# Patient Record
Sex: Female | Born: 1990 | Race: Black or African American | Hispanic: No | Marital: Single | Smoking: Former smoker
Health system: Southern US, Community
[De-identification: ages and names within clinical notes are randomized; demographics above are authoritative.]

## PROBLEM LIST (undated history)

## (undated) ENCOUNTER — Inpatient Hospital Stay (HOSPITAL_COMMUNITY): Payer: Self-pay

## (undated) DIAGNOSIS — Z789 Other specified health status: Secondary | ICD-10-CM

## (undated) DIAGNOSIS — Z283 Underimmunization status: Secondary | ICD-10-CM

## (undated) DIAGNOSIS — O09899 Supervision of other high risk pregnancies, unspecified trimester: Secondary | ICD-10-CM

## (undated) DIAGNOSIS — F329 Major depressive disorder, single episode, unspecified: Secondary | ICD-10-CM

## (undated) DIAGNOSIS — O139 Gestational [pregnancy-induced] hypertension without significant proteinuria, unspecified trimester: Secondary | ICD-10-CM

## (undated) HISTORY — DX: Supervision of other high risk pregnancies, unspecified trimester: O09.899

## (undated) HISTORY — DX: Gestational (pregnancy-induced) hypertension without significant proteinuria, unspecified trimester: O13.9

## (undated) HISTORY — PX: NO PAST SURGERIES: SHX2092

## (undated) HISTORY — PX: DILATION AND CURETTAGE OF UTERUS: SHX78

## (undated) HISTORY — DX: Underimmunization status: Z28.3

## (undated) HISTORY — PX: INDUCED ABORTION: SHX677

## (undated) HISTORY — DX: Major depressive disorder, single episode, unspecified: F32.9

---
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
## 2013-03-28 ENCOUNTER — Encounter (HOSPITAL_COMMUNITY): Payer: Self-pay | Admitting: Emergency Medicine

## 2013-03-28 ENCOUNTER — Emergency Department (HOSPITAL_COMMUNITY)
Admission: EM | Admit: 2013-03-28 | Discharge: 2013-03-28 | Disposition: A | Discharge status: Home / Self Care | Payer: Self-pay | Attending: Emergency Medicine | Admitting: Emergency Medicine

## 2013-03-28 DIAGNOSIS — J029 Acute pharyngitis, unspecified: Secondary | ICD-10-CM | POA: Insufficient documentation

## 2013-03-28 DIAGNOSIS — R059 Cough, unspecified: Secondary | ICD-10-CM | POA: Insufficient documentation

## 2013-03-28 DIAGNOSIS — F17200 Nicotine dependence, unspecified, uncomplicated: Secondary | ICD-10-CM | POA: Insufficient documentation

## 2013-03-28 DIAGNOSIS — R05 Cough: Secondary | ICD-10-CM | POA: Insufficient documentation

## 2013-03-28 DIAGNOSIS — H9209 Otalgia, unspecified ear: Secondary | ICD-10-CM | POA: Insufficient documentation

## 2013-03-28 DIAGNOSIS — Z8659 Personal history of other mental and behavioral disorders: Secondary | ICD-10-CM | POA: Insufficient documentation

## 2013-03-28 DIAGNOSIS — Z8619 Personal history of other infectious and parasitic diseases: Secondary | ICD-10-CM | POA: Insufficient documentation

## 2013-03-28 LAB — RAPID STREP SCREEN (MED CTR MEBANE ONLY): Streptococcus, Group A Screen (Direct): NEGATIVE

## 2013-03-28 MED ORDER — IBUPROFEN 800 MG PO TABS: 800.0000 mg | ORAL_TABLET | Freq: Three times a day (TID) | ORAL | Status: DC

## 2013-03-28 MED ORDER — LIDOCAINE VISCOUS 2 % MT SOLN: 20.0000 mL | OROMUCOSAL | Status: DC | PRN

## 2013-03-28 NOTE — ED Notes (Signed)
Per pt sts sore throat since Tuesday night and hard to swallow

## 2013-03-28 NOTE — ED Provider Notes (Signed)
CSN: [id number]     Arrival date & time 03/28/13  1237 History  This chart was scribed for non-physician practitioner, Milus Mallick, PA-C working with Ward Givens, MD by Greggory Stallion, ED scribe. This patient was seen in room TR08C/TR08C and the patient's care was started at 1:58 PM.   Chief Complaint  Patient presents with  . Sore Throat   The history is provided by the patient. No language interpreter was used.   HPI Comments: Ashley Jennings is a 22 y.o. female who presents to the Emergency Department complaining of gradual onset, constant sore throat with associated mild non productive cough that started last night. Pt has states had right ear pain earlier but denies it currently. Denies any alleviating or aggravating factors. Denies fevers, rhinorrhea, congestion, fever. Denies sick contacts.   Past Medical History  Diagnosis Date  . No pertinent past medical history   . Pregnancy induced hypertension   . Depression jan 2012    states just short-term when her mom passed away   . Maternal varicella, non-immune 06/01/2011    Immunize postpartum    Past Surgical History  Procedure Laterality Date  . No past surgeries     Family History  Problem Relation Age of Onset  . Anesthesia problems Neg Hx   . Diabetes Mother   . Kidney disease Mother   . Hypertension Mother    History  Substance Use Topics  . Smoking status: Current Some Day Smoker  . Smokeless tobacco: Never Used  . Alcohol Use: No   OB History   Grav Para Term Preterm Abortions TAB SAB Ect Mult Living   1 1 1  0 0 0 0 0 0 1     Review of Systems  Constitutional: Negative for fever.  HENT: Positive for sore throat. Negative for congestion, ear pain and rhinorrhea.   Respiratory: Positive for cough.   All other systems reviewed and are negative.    Allergies  Review of patient's allergies indicates no known allergies.  Home Medications   Current Outpatient Rx  Name  Route  Sig  Dispense   Refill  . ibuprofen (ADVIL,MOTRIN) 800 MG tablet   Oral   Take 1 tablet (800 mg total) by mouth 3 (three) times daily.   21 tablet   0   . lidocaine (XYLOCAINE) 2 % solution   Mouth/Throat   Use as directed 20 mLs in the mouth or throat as needed for mouth pain.   100 mL   0     BP 131/75  Pulse 91  Temp(Src) 99.3 F (37.4 C) (Oral)  SpO2 99%  Physical Exam  Constitutional: She is oriented to person, place, and time. She appears well-developed and well-nourished. No distress.  HENT:  Head: Normocephalic and atraumatic.  Right Ear: Tympanic membrane, external ear and ear canal normal.  Left Ear: Tympanic membrane, external ear and ear canal normal.  Nose: Nose normal.  Mouth/Throat: Uvula is midline, oropharynx is clear and moist and mucous membranes are normal. No trismus in the jaw. No oropharyngeal exudate, posterior oropharyngeal edema or tonsillar abscesses.  Eyes: Conjunctivae are normal.  Neck: Normal range of motion. Neck supple.  Cardiovascular: Normal rate, regular rhythm and normal heart sounds.   Pulmonary/Chest: Effort normal and breath sounds normal. No respiratory distress. She has no wheezes. She exhibits no tenderness.  Abdominal: Soft. There is no tenderness.  Musculoskeletal: Normal range of motion.  Lymphadenopathy:    She has cervical adenopathy.  Neurological: She  is alert and oriented to person, place, and time.  Skin: Skin is warm and dry. She is not diaphoretic.  Psychiatric: She has a normal mood and affect.    ED Course  Procedures (including critical care time)  DIAGNOSTIC STUDIES: Oxygen Saturation is 99% on RA, normal by my interpretation.    COORDINATION OF CARE: 2:00 PM-Discussed treatment plan which includes magic mouth wash and alternating tylenol and ibuprofen with pt at bedside and pt agreed to plan.   Labs Review Labs Reviewed  RAPID STREP SCREEN  CULTURE, GROUP A STREP   Imaging Review No results found.  EKG  Interpretation   None       MDM   1. Viral pharyngitis     Afebrile, NAD, non-toxic appearing, AAOx4. Pt afebrile without tonsillar exudate, negative strep. Presents with mild cervical lymphadenopathy, & dysphagia; diagnosis of viral pharyngitis. No abx indicated. DC w symptomatic tx for pain  Pt does not appear dehydrated, but did discuss importance of water rehydration. Presentation non concerning for PTA or infxn spread to soft tissue. No trismus or uvula deviation. Specific return precautions discussed. Pt able to drink water in ED without difficulty with intact air way. Recommended PCP follow up. Return precautions discussed. Patient is agreeable to plan. Patient is stable at time of discharge      I personally performed the services described in this documentation, which was scribed in my presence. The recorded information has been reviewed and is accurate.    Jeannetta Ellis, PA-C 03/28/13 1537

## 2013-03-28 NOTE — ED Provider Notes (Signed)
Medical screening examination/treatment/procedure(s) were performed by non-physician practitioner and as supervising physician I was immediately available for consultation/collaboration.  EKG Interpretation   None       Marcoantonio Legault, MD, FACEP   Yonna Alwin L Wanita Derenzo, MD 03/28/13 1620 

## 2013-03-30 LAB — CULTURE, GROUP A STREP

## 2013-09-17 IMAGING — US US OB FOLLOW-UP
1 series · 12 of 28 positions shown · non-contrast
Comparison: none

[Series 1: us ob follow up · 12 of 47 slices shown]
[im 2/47]
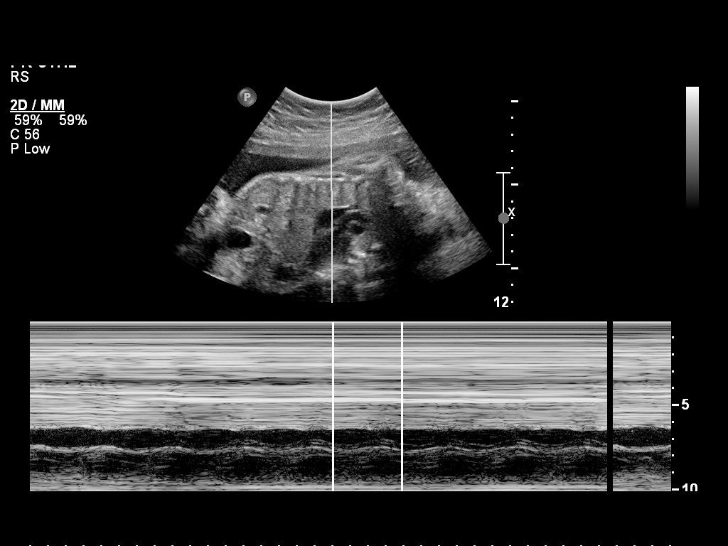
[im 6/47]
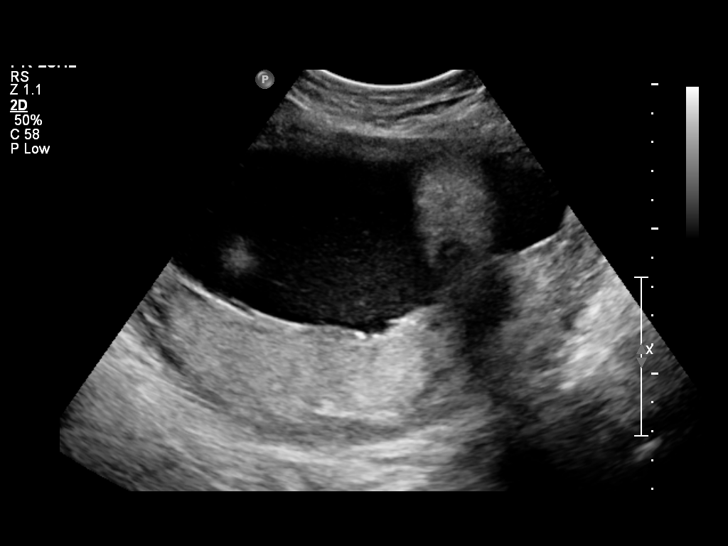
[im 9/47]
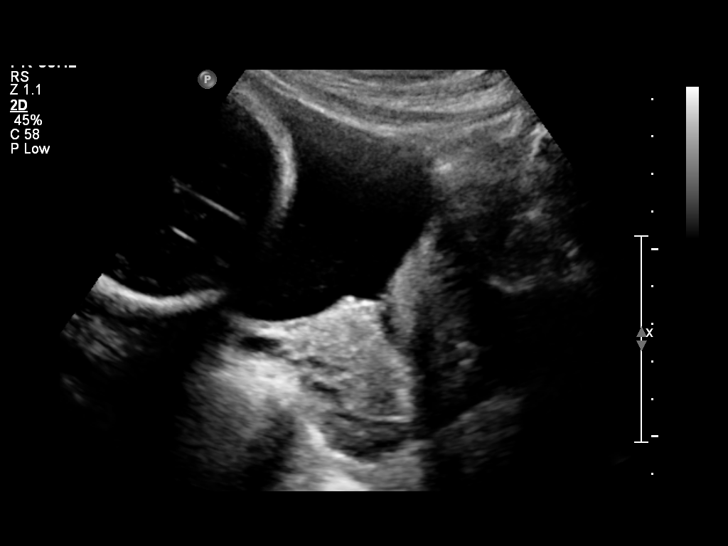
[im 14/47]
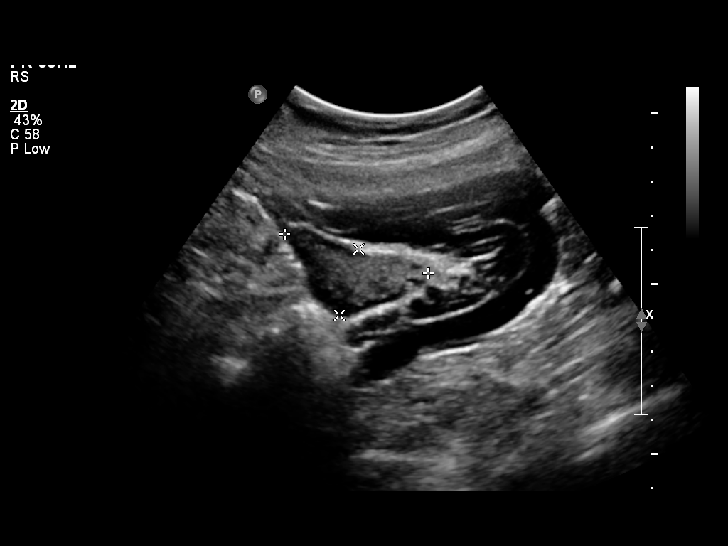
[im 18/47]
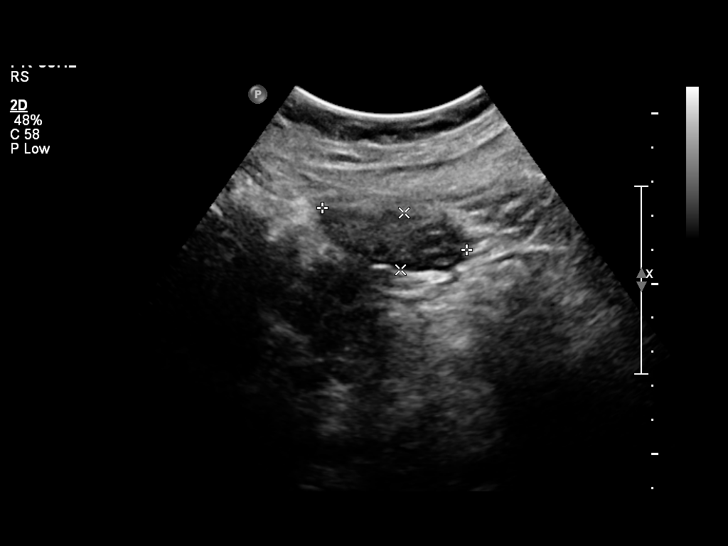
[im 21/47]
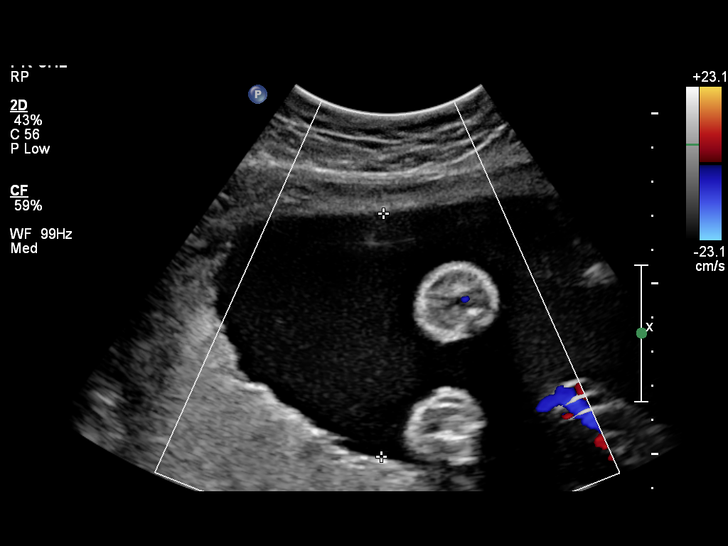
[im 26/47]
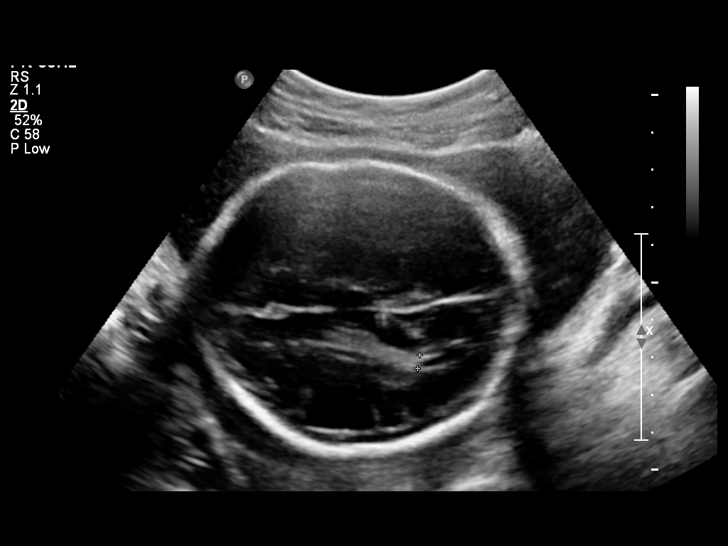
[im 29/47]
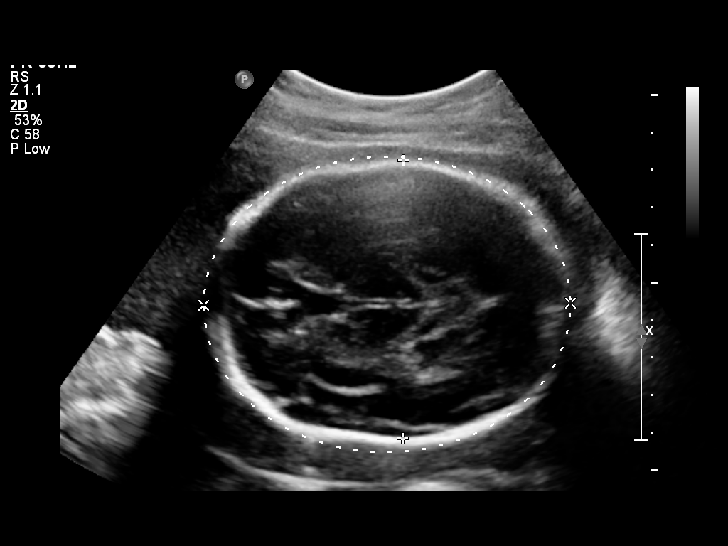
[im 33/47]
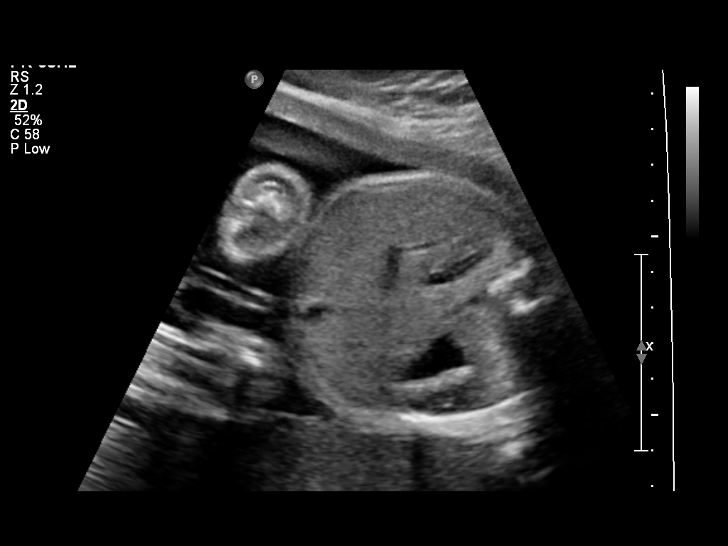
[im 38/47]
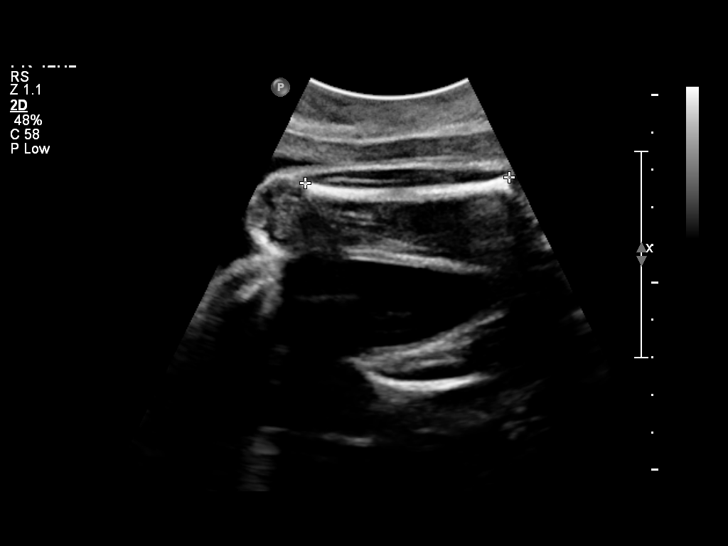
[im 41/47]
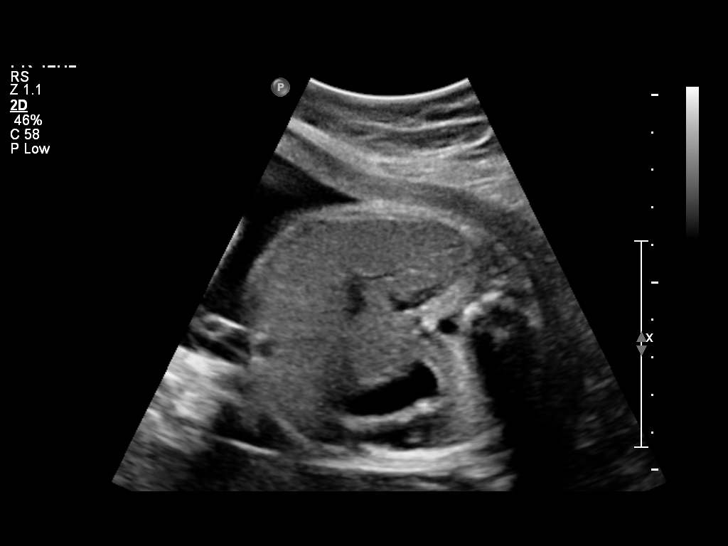
[im 45/47]
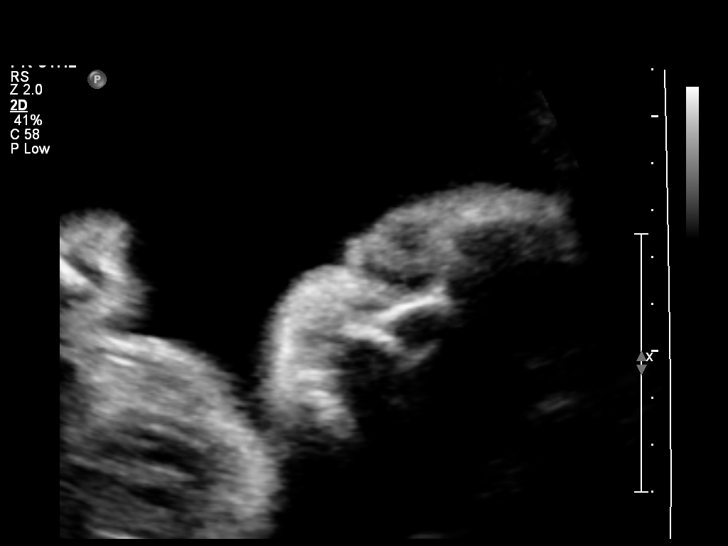

[12 of 28 positions shown; findings below may reference images not displayed]

OBSTETRICS REPORT
                      (Signed Final 05/17/2011 [DATE])

          [facility]

 Order#:         [id number]
Procedures

 US OB FOLLOW UP                                       76816.1
Indications

 Assess Fetal Growth / Estimated Fetal Weight
 Size greater than dates (Large for gestational [AGE]
Fetal Evaluation

 Fetal Heart Rate:  155                         bpm
 Cardiac Activity:  Observed
 Presentation:      Cephalic
 Placenta:          Posterior, above cervical
                    os
 P. Cord            Previously Visualized
 Insertion:

 Amniotic Fluid
 AFI FV:      Subjectively within normal limits
 AFI Sum:     16.41   cm      60   %Tile     Larg Pckt:     7.1  cm
 RUQ:   7.1    cm    RLQ:    4.72   cm    LUQ:   4.59    cm
Biometry

 BPD:     74.1  mm    G. Age:   29w 5d                CI:         72.1   70 - 86
                                                      FL/HC:      19.6   18.8 -

 HC:     277.7  mm    G. Age:   30w 3d       77  %    HC/AC:      1.16   1.05 -

 AC:     240.1  mm    G. Age:   28w 2d       39  %    FL/BPD:     73.4   71 - 87
 FL:      54.4  mm    G. Age:   28w 5d       45  %    FL/AC:      22.7   20 - 24

 Est. FW:    1400  gm    2 lb 13 oz      57  %
Gestational Age

 LMP:           28w 3d       Date:   10/30/10                 EDD:   08/06/11
 U/S Today:     29w 2d                                        EDD:   07/31/11
 Best:          28w 3d    Det. By:   LMP  (10/30/10)          EDD:   08/06/11
Anatomy
 Cranium:           Appears normal      Aortic Arch:       Previously seen
 Fetal Cavum:       Previously seen     Ductal Arch:       Previously seen
 Ventricles:        Appears normal      Diaphragm:         Appears normal
 Choroid Plexus:    Previously seen     Stomach:           Appears normal
 Cerebellum:        Previously seen     Abdomen:           Appears normal
 Posterior Fossa:   Previously seen     Abdominal Wall:    Previously seen
 Nuchal Fold:       Not applicable      Cord Vessels:      Previously seen
                    (>20 wks GA)
 Face:              Previously seen     Kidneys:           Appear normal
 Heart:             Not well            Bladder:           Appears normal
                    visualized
                    today. Seen
                    previously.
 RVOT:              Previously seen     Spine:             Previously seen
 LVOT:              Previously seen     Limbs:             Previously seen

 Other:     Female gender previously seen. Heels and 5th digit
            previously visualized.
Targeted Anatomy

 Fetal Central Nervous System
 Lat. Ventricles:
Cervix Uterus Adnexa

 Cervical Length:   3.22      cm

 Cervix:       Normal appearance by transabdominal scan.
 Left Ovary:   Within normal limits.
 Right Ovary:  Within normal limits.
 Adnexa:     No abnormality visualized.
Impression

 Single live IUP in cephalic presentation.  Concordant
 measurements/assigned GA by LMP.
 No late-developing anomaly in visualized structures above.

## 2014-02-04 ENCOUNTER — Encounter (HOSPITAL_COMMUNITY): Payer: Self-pay | Admitting: Emergency Medicine

## 2016-06-18 ENCOUNTER — Emergency Department (HOSPITAL_COMMUNITY): Payer: Self-pay

## 2016-06-18 ENCOUNTER — Emergency Department (HOSPITAL_COMMUNITY)
Admission: EM | Admit: 2016-06-18 | Discharge: 2016-06-18 | Disposition: A | Discharge status: Home / Self Care | Payer: Self-pay | Attending: Physician Assistant | Admitting: Physician Assistant

## 2016-06-18 ENCOUNTER — Encounter (HOSPITAL_COMMUNITY): Payer: Self-pay

## 2016-06-18 DIAGNOSIS — O23591 Infection of other part of genital tract in pregnancy, first trimester: Secondary | ICD-10-CM | POA: Insufficient documentation

## 2016-06-18 DIAGNOSIS — F17200 Nicotine dependence, unspecified, uncomplicated: Secondary | ICD-10-CM | POA: Insufficient documentation

## 2016-06-18 DIAGNOSIS — O99331 Smoking (tobacco) complicating pregnancy, first trimester: Secondary | ICD-10-CM | POA: Insufficient documentation

## 2016-06-18 DIAGNOSIS — Z3A11 11 weeks gestation of pregnancy: Secondary | ICD-10-CM | POA: Insufficient documentation

## 2016-06-18 DIAGNOSIS — Z79899 Other long term (current) drug therapy: Secondary | ICD-10-CM | POA: Insufficient documentation

## 2016-06-18 DIAGNOSIS — N760 Acute vaginitis: Secondary | ICD-10-CM

## 2016-06-18 DIAGNOSIS — Z9889 Other specified postprocedural states: Secondary | ICD-10-CM

## 2016-06-18 DIAGNOSIS — O0281 Inappropriate change in quantitative human chorionic gonadotropin (hCG) in early pregnancy: Secondary | ICD-10-CM | POA: Insufficient documentation

## 2016-06-18 DIAGNOSIS — B9689 Other specified bacterial agents as the cause of diseases classified elsewhere: Secondary | ICD-10-CM | POA: Insufficient documentation

## 2016-06-18 LAB — CBC WITH DIFFERENTIAL/PLATELET
Basophils Absolute: 0 10*3/uL (ref 0.0–0.1)
Basophils Relative: 1 %
Eosinophils Absolute: 0.1 10*3/uL (ref 0.0–0.7)
Eosinophils Relative: 2 %
HCT: 36.3 % (ref 36.0–46.0)
Hemoglobin: 11.8 g/dL — ABNORMAL LOW (ref 12.0–15.0)
Lymphocytes Relative: 32 %
Lymphs Abs: 1.5 10*3/uL (ref 0.7–4.0)
MCH: 26.3 pg (ref 26.0–34.0)
MCHC: 32.5 g/dL (ref 30.0–36.0)
MCV: 80.8 fL (ref 78.0–100.0)
Monocytes Absolute: 0.5 10*3/uL (ref 0.1–1.0)
Monocytes Relative: 11 %
Neutro Abs: 2.5 10*3/uL (ref 1.7–7.7)
Neutrophils Relative %: 54 %
Platelets: 307 10*3/uL (ref 150–400)
RBC: 4.49 MIL/uL (ref 3.87–5.11)
RDW: 15.7 % — ABNORMAL HIGH (ref 11.5–15.5)
WBC: 4.6 10*3/uL (ref 4.0–10.5)

## 2016-06-18 LAB — URINALYSIS, ROUTINE W REFLEX MICROSCOPIC
Bilirubin Urine: NEGATIVE
Glucose, UA: NEGATIVE mg/dL
Ketones, ur: NEGATIVE mg/dL
Nitrite: NEGATIVE
Protein, ur: 30 mg/dL — AB
Specific Gravity, Urine: 1.026 (ref 1.005–1.030)
pH: 6 (ref 5.0–8.0)

## 2016-06-18 LAB — WET PREP, GENITAL
Sperm: NONE SEEN
Trich, Wet Prep: NONE SEEN
Yeast Wet Prep HPF POC: NONE SEEN

## 2016-06-18 LAB — HCG, QUANTITATIVE, PREGNANCY: hCG, Beta Chain, Quant, S: 159 m[IU]/mL — ABNORMAL HIGH (ref <5)

## 2016-06-18 MED ORDER — METRONIDAZOLE 500 MG PO TABS
500.0000 mg | ORAL_TABLET | Freq: Once | ORAL | Status: AC
  Administered 2016-06-18: 500 mg via ORAL
  Filled 2016-06-18: qty 1

## 2016-06-18 MED ORDER — METRONIDAZOLE 500 MG PO TABS: 500.0000 mg | ORAL_TABLET | Freq: Two times a day (BID) | ORAL | 0 refills | Status: AC

## 2016-06-18 MED ORDER — IBUPROFEN 800 MG PO TABS
800.0000 mg | ORAL_TABLET | Freq: Once | ORAL | Status: AC
  Administered 2016-06-18: 800 mg via ORAL
  Filled 2016-06-18: qty 1

## 2016-06-18 NOTE — ED Notes (Signed)
Patient transported to Ultrasound 

## 2016-06-18 NOTE — ED Triage Notes (Signed)
Pt presents for evaluation of vaginal discharge and odor x 3-4 days. Pt reports having surgical abortion 3/2. States vaginal bleeding since. Reports had some intermittent vaginal odor x 1-2 months but has not been seen.

## 2016-06-18 NOTE — ED Provider Notes (Signed)
[facility] DEPT Provider Note   CSN: [id number] Arrival date & time: 06/18/16  0845     History   Chief Complaint Chief Complaint  Patient presents with  . Vaginal Discharge    HPI Ashley Jennings is a 26 y.o. female [id number] who presents today with chief complaint of vaginal discharge and odor. She states she had an abortion on 3/2 when she was [redacted] weeks gestational age and has had 6/10 abdominal cramping with intermittent nausea and back pain since then. She took ibuprofen for this which was helpful but has since run out. She noticed a foul odor from her vagina 4 days ago, and states developing copious yellow, "chunky" discharge 2 days ago. She states she had the odor intermittently prior to her abortion but "this time it smells really bad" and had never been accompanied by the discharge before. Discharge is somewhat itchy. Denies recent abx use. Also endorses vaginal bleeding since procedure which is now improving.   Denies fever/chills, HA, dizziness, CP/SOB, vomitting, diarrhea, constipation, melena, hematuria, dysuria.  The history is provided by the patient.    Past Medical History:  Diagnosis Date  . Depression jan 2012   states just short-term when her mom passed away   . Maternal varicella, non-immune 06/01/2011   Immunize postpartum   . No pertinent past medical history   . Pregnancy induced hypertension     Patient Active Problem List   Diagnosis Date Noted  . Postpartum examination following vaginal delivery 09/02/2011  . Gonorrhea complicating pregnancy in second trimester 06/17/2011  . Gestational HTN 06/01/2011    Past Surgical History:  Procedure Laterality Date  . DILATION AND CURETTAGE OF UTERUS    . NO PAST SURGERIES      OB History    Gravida Para Term Preterm AB Living   1 1 1  0 0 1   SAB TAB Ectopic Multiple Live Births   0 0 0 0 1       Home Medications    Prior to Admission medications   Medication Sig Start Date End Date  Taking? Authorizing Provider  ibuprofen (ADVIL,MOTRIN) 800 MG tablet Take 1 tablet (800 mg total) by mouth 3 (three) times daily. Patient not taking: Reported on 06/18/2016 03/28/13   Baron Sane, PA-C  lidocaine (XYLOCAINE) 2 % solution Use as directed 20 mLs in the mouth or throat as needed for mouth pain. Patient not taking: Reported on 06/18/2016 03/28/13   Baron Sane, PA-C  metroNIDAZOLE (FLAGYL) 500 MG tablet Take 1 tablet (500 mg total) by mouth 2 (two) times daily. 06/18/16 06/25/16  Renita Papa, PA    Family History Family History  Problem Relation Age of Onset  . Diabetes Mother   . Kidney disease Mother   . Hypertension Mother   . Anesthesia problems Neg Hx     Social History Social History  Substance Use Topics  . Smoking status: Current Some Day Smoker  . Smokeless tobacco: Never Used  . Alcohol use No     Allergies   Patient has no known allergies.   Review of Systems Review of Systems  Constitutional: Negative for chills and fever.  Respiratory: Negative for chest tightness and shortness of breath.   Cardiovascular: Negative for chest pain and leg swelling.  Gastrointestinal: Positive for abdominal pain and nausea. Negative for abdominal distention, blood in stool, constipation, diarrhea and vomiting.  Genitourinary: Positive for vaginal bleeding and vaginal discharge. Negative for dysuria and hematuria.  Musculoskeletal: Positive for back  pain.  Skin: Negative for pallor and wound.  Neurological: Negative for dizziness, light-headedness and headaches.     Physical Exam Updated Vital Signs BP 127/75   Pulse 63   Temp 98.1 F (36.7 C) (Oral)   Resp 16   Ht 5\' 9"  (1.753 m)   Wt 90.7 kg   LMP 03/18/2016 (Approximate)   SpO2 98%   BMI 29.53 kg/m   Physical Exam  Constitutional: She is oriented to person, place, and time. She appears well-developed and well-nourished. No distress.  HENT:  Head: Normocephalic and atraumatic.  Eyes:  Conjunctivae are normal. Right eye exhibits no discharge. Left eye exhibits no discharge. No scleral icterus.  Neck: Neck supple. No JVD present. No tracheal deviation present. No thyromegaly present.  Cardiovascular: Normal rate, regular rhythm, normal heart sounds and intact distal pulses.   Pulmonary/Chest: Effort normal and breath sounds normal.  Abdominal: Soft. Bowel sounds are normal. There is tenderness.  Suprapubic tenderness to palpation  Genitourinary: Vaginal discharge found.  Genitourinary Comments: Yellow, malodorous discharge, no blood noted. No plaques or petechia noted. No cervical wall motion tenderness, no masses or abnormalities palpated on bimanual exam; ovaries nonpalpable, uterus soft and not enlarged. No products of conception visualized.  Musculoskeletal: Normal range of motion. She exhibits no deformity.  Neurological: She is alert and oriented to person, place, and time.  Skin: Skin is warm and dry. Capillary refill takes less than 2 seconds. She is not diaphoretic.  Psychiatric: She has a normal mood and affect. Her behavior is normal. Judgment and thought content normal.     ED Treatments / Results  Labs (all labs ordered are listed, but only abnormal results are displayed) Labs Reviewed  WET PREP, GENITAL - Abnormal; Notable for the following:       Result Value   Clue Cells Wet Prep HPF POC PRESENT (*)    WBC, Wet Prep HPF POC MANY (*)    All other components within normal limits  HCG, QUANTITATIVE, PREGNANCY - Abnormal; Notable for the following:    hCG, Beta Chain, Quant, S 159 (*)    All other components within normal limits  CBC WITH DIFFERENTIAL/PLATELET - Abnormal; Notable for the following:    Hemoglobin 11.8 (*)    RDW 15.7 (*)    All other components within normal limits  URINALYSIS, ROUTINE W REFLEX MICROSCOPIC - Abnormal; Notable for the following:    Hgb urine dipstick SMALL (*)    Protein, ur 30 (*)    Leukocytes, UA TRACE (*)     Bacteria, UA MANY (*)    Squamous Epithelial / LPF 0-5 (*)    All other components within normal limits  GC/CHLAMYDIA PROBE AMP ([place]) NOT AT [facility]    EKG  EKG Interpretation None       Radiology US Ob Comp < 14 Wks  Result Date: 06/18/2016 CLINICAL DATA:  Pelvic pain and vaginal bleeding beginning today. Two weeks status post therapeutic abortion. Positive beta HCG. EXAM: OBSTETRIC <14 WK [place] AND TRANSVAGINAL OB US TECHNIQUE: Both transabdominal and transvaginal ultrasound examinations were performed for complete evaluation of the gestation as well as the maternal uterus, adnexal regions, and pelvic cul-de-sac. Transvaginal technique was performed to assess early pregnancy. COMPARISON:  None. FINDINGS: Intrauterine gestational sac: None Endometrium shows heterogeneous echotexture and measures 18 mm in thickness. No definite internal blood flow seen on color Doppler ultrasound. Both ovaries are normal in appearance. No evidence of adnexal mass or free fluid. IMPRESSION: Heterogeneous endometrial  thickening measuring 18 mm. This is suspicious for hypovascular retained products of conception. Normal appearance of both ovaries. No evidence of adnexal mass or free fluid. Electronically Signed   By: Earle Gell M.D.   On: 06/18/2016 12:41   US Ob Transvaginal  Result Date: 06/18/2016 CLINICAL DATA:  Pelvic pain and vaginal bleeding beginning today. Two weeks status post therapeutic abortion. Positive beta HCG. EXAM: OBSTETRIC <14 WK [place] AND TRANSVAGINAL OB US TECHNIQUE: Both transabdominal and transvaginal ultrasound examinations were performed for complete evaluation of the gestation as well as the maternal uterus, adnexal regions, and pelvic cul-de-sac. Transvaginal technique was performed to assess early pregnancy. COMPARISON:  None. FINDINGS: Intrauterine gestational sac: None Endometrium shows heterogeneous echotexture and measures 18 mm in thickness. No definite internal blood flow seen on  color Doppler ultrasound. Both ovaries are normal in appearance. No evidence of adnexal mass or free fluid. IMPRESSION: Heterogeneous endometrial thickening measuring 18 mm. This is suspicious for hypovascular retained products of conception. Normal appearance of both ovaries. No evidence of adnexal mass or free fluid. Electronically Signed   By: Earle Gell M.D.   On: 06/18/2016 12:41    Procedures Procedures (including critical care time)  Medications Ordered in ED Medications  metroNIDAZOLE (FLAGYL) tablet 500 mg (not administered)  ibuprofen (ADVIL,MOTRIN) tablet 800 mg (800 mg Oral Given 06/18/16 1127)     Initial Impression / Assessment and Plan / ED Course  I have reviewed the triage vital signs and the nursing notes.  Pertinent labs & imaging results that were available during my care of the patient were reviewed by me and considered in my medical decision making (see chart for details).     25yof presents to ED today with chief complaint of vaginal odor and discharge s/p D&C 2 weeks ago. Pt afebrile, VSS. Provided pt with ibuprofen for pain, with some improvement on re-evaluation. Quantitative beta HCG shows low amount inconsistent with expected gestational age. Pelvic exam shows yellow malodorous discharge with no blood or products of conception visualized. Wet prep shows many clue cells supporting dx of bacterial vaginosis.  Transvaginal US shows heterogenous endometrial thickening and suspicion for retained products of conception. No evidence of adnexal mass or free fluid, low suspicion of ovarian cyst/torsion. Consulted with [facility], who performed D&C on 3/2, who recommended pt come in to their office tomorrow at [facility] for re-evaluation and possible repetition of procedure. Pt also given first dose of Flagyl for treatment of BV and instructed how to complete course. Pt informed she should avoid alcohol while on abx. She is aware of follow up tomorrow with [facility] and  need for driver. Also discussed indications for return to ED including development of fever/chills, vaginal bleeding, and severe abdominal pain.   Pt seen and evaluated by Dr. Thomasene Lot. Final Clinical Impressions(s) / ED Diagnoses   Final diagnoses:  BV (bacterial vaginosis)  Status post D&C    New Prescriptions New Prescriptions   METRONIDAZOLE (FLAGYL) 500 MG TABLET    Take 1 tablet (500 mg total) by mouth 2 (two) times daily.     Renita Papa, [place] 06/18/16 [place], MD 06/18/16 2145

## 2016-06-21 LAB — GC/CHLAMYDIA PROBE AMP (~~LOC~~) NOT AT ARMC
Chlamydia: POSITIVE — AB
Neisseria Gonorrhea: NEGATIVE

## 2016-06-24 ENCOUNTER — Emergency Department (HOSPITAL_COMMUNITY)
Admission: EM | Admit: 2016-06-24 | Discharge: 2016-06-24 | Disposition: A | Discharge status: Home / Self Care | Payer: Self-pay | Attending: Emergency Medicine | Admitting: Emergency Medicine

## 2016-06-24 ENCOUNTER — Encounter (HOSPITAL_COMMUNITY): Payer: Self-pay

## 2016-06-24 DIAGNOSIS — Z79899 Other long term (current) drug therapy: Secondary | ICD-10-CM | POA: Insufficient documentation

## 2016-06-24 DIAGNOSIS — F17200 Nicotine dependence, unspecified, uncomplicated: Secondary | ICD-10-CM | POA: Insufficient documentation

## 2016-06-24 DIAGNOSIS — A749 Chlamydial infection, unspecified: Secondary | ICD-10-CM | POA: Insufficient documentation

## 2016-06-24 MED ORDER — CEFTRIAXONE SODIUM 250 MG IJ SOLR
250.0000 mg | Freq: Once | INTRAMUSCULAR | Status: AC
  Administered 2016-06-24: 250 mg via INTRAMUSCULAR
  Filled 2016-06-24: qty 250

## 2016-06-24 MED ORDER — STERILE WATER FOR INJECTION IJ SOLN
INTRAMUSCULAR | Status: AC
  Administered 2016-06-24: 2 mL
  Filled 2016-06-24: qty 10

## 2016-06-24 MED ORDER — AZITHROMYCIN 250 MG PO TABS
1000.0000 mg | ORAL_TABLET | Freq: Once | ORAL | Status: AC
  Administered 2016-06-24: 1000 mg via ORAL
  Filled 2016-06-24: qty 4

## 2016-06-24 NOTE — ED Provider Notes (Signed)
[facility] DEPT Provider Note   CSN: [id number] Arrival date & time: 06/24/16  0830     History   Chief Complaint Chief Complaint  Patient presents with  . Exposure to STD    HPI Willisha L Beaird is a 26 y.o. female.  Patient is a 26 year old female who presents for evaluation of STD. She was seen one week ago and had cervical swabs obtained. She was notified that her chlamydia test was positive. She denies any other complaints. She is only here for treatment.   The history is provided by the patient.  Exposure to STD  This is a new problem. Episode onset: 1 week ago. The problem occurs constantly. The problem has not changed since onset.Pertinent negatives include no abdominal pain. Nothing aggravates the symptoms. Nothing relieves the symptoms. She has tried nothing for the symptoms.    Past Medical History:  Diagnosis Date  . Depression jan 2012   states just short-term when her mom passed away   . Maternal varicella, non-immune 06/01/2011   Immunize postpartum   . No pertinent past medical history   . Pregnancy induced hypertension     Patient Active Problem List   Diagnosis Date Noted  . Postpartum examination following vaginal delivery 09/02/2011  . Gonorrhea complicating pregnancy in second trimester 06/17/2011  . Gestational HTN 06/01/2011    Past Surgical History:  Procedure Laterality Date  . DILATION AND CURETTAGE OF UTERUS    . INDUCED ABORTION    . NO PAST SURGERIES      OB History    Gravida Para Term Preterm AB Living   1 1 1  0 0 1   SAB TAB Ectopic Multiple Live Births   0 0 0 0 1       Home Medications    Prior to Admission medications   Medication Sig Start Date End Date Taking? Authorizing Provider  ibuprofen (ADVIL,MOTRIN) 800 MG tablet Take 1 tablet (800 mg total) by mouth 3 (three) times daily. Patient not taking: Reported on 06/18/2016 03/28/13   Baron Sane, PA-C  lidocaine (XYLOCAINE) 2 % solution Use as  directed 20 mLs in the mouth or throat as needed for mouth pain. Patient not taking: Reported on 06/18/2016 03/28/13   Baron Sane, PA-C  metroNIDAZOLE (FLAGYL) 500 MG tablet Take 1 tablet (500 mg total) by mouth 2 (two) times daily. 06/18/16 06/25/16  Renita Papa, PA    Family History Family History  Problem Relation Age of Onset  . Diabetes Mother   . Kidney disease Mother   . Hypertension Mother   . Anesthesia problems Neg Hx     Social History Social History  Substance Use Topics  . Smoking status: Current Some Day Smoker  . Smokeless tobacco: Never Used  . Alcohol use No     Allergies   Patient has no known allergies.   Review of Systems Review of Systems  Gastrointestinal: Negative for abdominal pain.  All other systems reviewed and are negative.    Physical Exam Updated Vital Signs BP (!) 144/88 (BP Location: Right Arm)   Pulse 100   Temp 98.9 F (37.2 C) (Oral)   Resp 16   Ht 5\' 10"  (1.778 m)   Wt 200 lb (90.7 kg)   SpO2 100%   BMI 28.70 kg/m   Physical Exam  Constitutional: She is oriented to person, place, and time. She appears well-developed and well-nourished. No distress.  HENT:  Head: Normocephalic and atraumatic.  Neck: Normal range of  motion. Neck supple.  Cardiovascular: Normal rate and regular rhythm.  Exam reveals no gallop and no friction rub.   No murmur heard. Pulmonary/Chest: Effort normal and breath sounds normal. No respiratory distress. She has no wheezes.  Abdominal: Soft. Bowel sounds are normal. She exhibits no distension. There is no tenderness.  Musculoskeletal: Normal range of motion.  Neurological: She is alert and oriented to person, place, and time.  Skin: Skin is warm and dry. She is not diaphoretic.  Nursing note and vitals reviewed.    ED Treatments / Results  Labs (all labs ordered are listed, but only abnormal results are displayed) Labs Reviewed - No data to display  EKG  EKG Interpretation None        Radiology No results found.  Procedures Procedures (including critical care time)  Medications Ordered in ED Medications  cefTRIAXone (ROCEPHIN) injection 250 mg (not administered)  azithromycin (ZITHROMAX) tablet 1,000 mg (not administered)     Initial Impression / Assessment and Plan / ED Course  I have reviewed the triage vital signs and the nursing notes.  Pertinent labs & imaging results that were available during my care of the patient were reviewed by me and considered in my medical decision making (see chart for details).  Rocephin and Zithromax given. She has been treated for BV at her last visit. Patient advised to refrain from unprotected sexual activity for the next 2 weeks, and is to follow-up as needed for any problems.  Final Clinical Impressions(s) / ED Diagnoses   Final diagnoses:  None    New Prescriptions New Prescriptions   No medications on file     Veryl Speak, MD 06/24/16 [phone]

## 2016-06-24 NOTE — ED Notes (Signed)
Pt states she was called by [facility] and informed she was positive for chlamydia and is here for tx. Pt states she recently had an abortion and has been bleeding, so unaware if she has been having any abnormal discharge.

## 2016-06-24 NOTE — ED Triage Notes (Addendum)
Per Pt, Pt is coming from home with positive result of Chlamydia. Pt is requesting to be treated. Pt reports discomfort in vagina and swollen. Denies discharge

## 2016-06-24 NOTE — Discharge Instructions (Signed)
Refrain from unprotected sexual activity for the next 2 weeks.  Follow-up with your primary Dr. as needed.

## 2016-06-24 NOTE — ED Notes (Signed)
Pt is in stable condition upon d/c and ambulates from ED. 

## 2019-03-18 ENCOUNTER — Encounter (HOSPITAL_COMMUNITY): Payer: Self-pay

## 2019-03-18 ENCOUNTER — Other Ambulatory Visit: Payer: Self-pay

## 2019-03-18 ENCOUNTER — Emergency Department (HOSPITAL_COMMUNITY)
Admission: EM | Admit: 2019-03-18 | Discharge: 2019-03-18 | Disposition: A | Discharge status: Home / Self Care | Payer: Medicaid Other | Attending: Emergency Medicine | Admitting: Emergency Medicine

## 2019-03-18 DIAGNOSIS — F17210 Nicotine dependence, cigarettes, uncomplicated: Secondary | ICD-10-CM | POA: Diagnosis not present

## 2019-03-18 DIAGNOSIS — R2242 Localized swelling, mass and lump, left lower limb: Secondary | ICD-10-CM | POA: Diagnosis present

## 2019-03-18 DIAGNOSIS — L03116 Cellulitis of left lower limb: Secondary | ICD-10-CM | POA: Insufficient documentation

## 2019-03-18 DIAGNOSIS — L02416 Cutaneous abscess of left lower limb: Secondary | ICD-10-CM | POA: Insufficient documentation

## 2019-03-18 DIAGNOSIS — Z23 Encounter for immunization: Secondary | ICD-10-CM | POA: Insufficient documentation

## 2019-03-18 MED ORDER — TETANUS-DIPHTH-ACELL PERTUSSIS 5-2.5-18.5 LF-MCG/0.5 IM SUSP
0.5000 mL | Freq: Once | INTRAMUSCULAR | Status: AC
  Administered 2019-03-18: 15:00:00 0.5 mL via INTRAMUSCULAR
  Filled 2019-03-18: qty 0.5

## 2019-03-18 MED ORDER — DOXYCYCLINE HYCLATE 100 MG PO CAPS: 100.0000 mg | ORAL_CAPSULE | Freq: Two times a day (BID) | ORAL | 0 refills | Status: DC

## 2019-03-18 MED ORDER — NAPROXEN 500 MG PO TABS: 500.0000 mg | ORAL_TABLET | Freq: Two times a day (BID) | ORAL | 0 refills | Status: DC

## 2019-03-18 MED ORDER — OXYCODONE-ACETAMINOPHEN 5-325 MG PO TABS
1.0000 | ORAL_TABLET | Freq: Once | ORAL | Status: AC
  Administered 2019-03-18: 15:00:00 1 via ORAL
  Filled 2019-03-18: qty 1

## 2019-03-18 NOTE — ED Triage Notes (Signed)
Per EMS- patient is from home. Patient c/o left calf abscess x 4 days.

## 2019-03-18 NOTE — Discharge Instructions (Addendum)
You were seen in the emergency department for a skin abscess- please see the attached handout for further information regarding this diagnoses. This area was incised and drained to help release the bacteria. We would like you to apply warm compresses and warm flushes to this area 4-5 times per day to help facilitate further draining as needed. We are also starting you on doxycycline, an antibiotic, in order to help treat the infection. We are also sending you home with naproxen to help with pain/swelling. Naproxen is a nonsteroidal anti-inflammatory medication that will help with pain and swelling. Be sure to take this medication as prescribed with food, 1 pill every 12 hours,  It should be taken with food, as it can cause stomach upset, and more seriously, stomach bleeding. Do not take other nonsteroidal anti-inflammatory medications with this such as Advil, Motrin, Aleve, Mobic, Goodie Powder, or Motrin.     You make take Tylenol per over the counter dosing with these medications.   We have prescribed you new medication(s) today. Discuss the medications prescribed today with your pharmacist as they can have adverse effects and interactions with your other medicines including over the counter and prescribed medications. Seek medical evaluation if you start to experience new or abnormal symptoms after taking one of these medicines, seek care immediately if you start to experience difficulty breathing, feeling of your throat closing, facial swelling, or rash as these could be indications of a more serious allergic reaction  We would like you to have this area rechecked within 48 hours- please return to the ER , go to an urgent care, or see your primary care provider for this. Return to the ER sooner for new or worsening symptoms including, but not limited to increased pain, spreading redness, fevers, inability to keep fluids down, or any other concerns that you may have.

## 2019-03-18 NOTE — ED Provider Notes (Signed)
[facility] DEPT Provider Note   CSN: [id number] Arrival date & time: 03/18/19  1259     History Chief Complaint  Patient presents with  . Abscess    Ashley Jennings is a 28 y.o. female with a history of tobacco abuse, depression, and pregnancy-induced hypertension who presents to the emergency department with concern for left lower leg infection over the past 4 days. Patient states area started as a pimple with surrounding erythema to the left lateral lower leg which has since expended with an area of swelling centrally. She states it is painful, worse with movement/palpation, no alleviating factors. Denies fever, chills, numbness, tingling, or weakness. Denies injury to the area or witnessed bug bite.   HPI     Past Medical History:  Diagnosis Date  . Depression jan 2012   states just short-term when her mom passed away   . Maternal varicella, non-immune 06/01/2011   Immunize postpartum   . No pertinent past medical history   . Pregnancy induced hypertension     Patient Active Problem List   Diagnosis Date Noted  . Postpartum examination following vaginal delivery 09/02/2011  . Gonorrhea complicating pregnancy in second trimester 06/17/2011  . Gestational HTN 06/01/2011    Past Surgical History:  Procedure Laterality Date  . DILATION AND CURETTAGE OF UTERUS    . INDUCED ABORTION    . NO PAST SURGERIES       OB History    Gravida  1   Para  1   Term  1   Preterm  0   AB  0   Living  1     SAB  0   TAB  0   Ectopic  0   Multiple  0   Live Births  1           Family History  Problem Relation Age of Onset  . Diabetes Mother   . Kidney disease Mother   . Hypertension Mother   . Anesthesia problems Neg Hx     Social History   Tobacco Use  . Smoking status: Current Some Day Smoker    Types: Cigarettes  . Smokeless tobacco: Never Used  Substance Use Topics  . Alcohol use: No  . Drug use: No     Home Medications Prior to Admission medications   Medication Sig Start Date End Date Taking? Authorizing Provider  ibuprofen (ADVIL,MOTRIN) 800 MG tablet Take 1 tablet (800 mg total) by mouth 3 (three) times daily. Patient not taking: Reported on 06/18/2016 03/28/13   Piepenbrink, Anderson Malta, PA-C  lidocaine (XYLOCAINE) 2 % solution Use as directed 20 mLs in the mouth or throat as needed for mouth pain. Patient not taking: Reported on 06/18/2016 03/28/13   Baron Sane, PA-C    Allergies    Patient has no known allergies.  Review of Systems   Review of Systems  Constitutional: Negative for chills and fever.  Respiratory: Negative for shortness of breath.   Cardiovascular: Negative for chest pain.  Musculoskeletal: Positive for myalgias.  Skin: Positive for color change and wound.  Neurological: Negative for weakness and numbness.    Physical Exam Updated Vital Signs BP 140/90 (BP Location: Right Arm)   Pulse 99   Temp 98 F (36.7 C) (Oral)   Resp 17   Ht 5' 8.5" (1.74 m)   Wt 97.5 kg   LMP 02/16/2019   SpO2 98%   BMI 32.22 kg/m   Physical Exam Vitals and nursing  note reviewed.  Constitutional:      General: She is not in acute distress.    Appearance: She is not ill-appearing or toxic-appearing.  HENT:     Head: Normocephalic and atraumatic.  Cardiovascular:     Pulses:          Dorsalis pedis pulses are 2+ on the right side and 2+ on the left side.       Posterior tibial pulses are 2+ on the right side and 2+ on the left side.  Pulmonary:     Effort: Pulmonary effort is normal.  Musculoskeletal:     Comments: Lower extremities: left lateral lower leg: there is an area of 12 cm diameter erythema & induration with central 3.5 cm diameter area of swelling/purulence with thin overlying layer of skin & underlying palpable fluctuance. Patient has intact AROM to bilateral hips, knees, ankles, and all digits. Tender to palpation over areas of erythema/swelling.  No point/focal bony tenderness. Compartments are soft. NVI distally.   Skin:    General: Skin is warm and dry.     Capillary Refill: Capillary refill takes less than 2 seconds.  Neurological:     Mental Status: She is alert.     Comments: Alert. Clear speech. Sensation grossly intact to bilateral lower extremities. 5/5 strength with plantar/dorsiflexion bilaterally. Patient ambulatory  Psychiatric:        Mood and Affect: Mood normal.        Behavior: Behavior normal.       ED Results / Procedures / Treatments   Labs (all labs ordered are listed, but only abnormal results are displayed) Labs Reviewed - No data to display  EKG None  Radiology No results found.  Procedures .Marland KitchenIncision and Drainage  Date/Time: 03/18/2019 2:15 PM Performed by: Amaryllis Dyke, PA-C Authorized by: Amaryllis Dyke, PA-C   Consent:    Consent obtained:  Verbal   Consent given by:  Patient   Risks discussed:  Bleeding, damage to other organs, infection, incomplete drainage and pain   Alternatives discussed:  No treatment Location:    Type:  Abscess   Size:  3.5   Location:  Lower extremity   Lower extremity location:  Leg   Leg location:  L lower leg Pre-procedure details:    Skin preparation:  Betadine Anesthesia (see MAR for exact dosages):    Anesthesia method:  Topical application   Topical anesthesia: Topical anesthetic spray. Procedure type:    Complexity:  Simple Procedure details:    Incision types:  Stab incision   Scalpel blade:  11   Wound management:  Probed and deloculated and irrigated with saline   Drainage:  Purulent and bloody   Drainage amount:  Moderate   Wound treatment:  Wound left open   Packing materials:  None Post-procedure details:    Patient tolerance of procedure:  Tolerated well, no immediate complications   (including critical care time)  Medications Ordered in ED Medications  Tdap (BOOSTRIX) injection 0.5 mL (has no administration  in time range)  oxyCODONE-acetaminophen (PERCOCET/ROXICET) 5-325 MG per tablet 1 tablet (has no administration in time range)    ED Course  I have reviewed the triage vital signs and the nursing notes.  Pertinent labs & imaging results that were available during my care of the patient were reviewed by me and considered in my medical decision making (see chart for details).  Patient presents to the ED with somewhat atypical appearing abscess with surrounding cellulitis amenable to I&D based  on exam.  Procedure per note above. Abscess cavity overall fairly superficial, did not warrant packing/drain placement. Given surrounding cellulitis will start patient on Doxycycline, wound culture pending. Naproxen prescription for pain. Will have patient return for wound recheck in 2 days. I discussed treatment plan, need for follow-up, and return precautions with the patient. Provided opportunity for questions, patient confirmed understanding and is in agreement with plan.   Findings and plan of care discussed with supervising physician Dr. Kathrynn Humble who is in agreement.   Final Clinical Impression(s) / ED Diagnoses Final diagnoses:  Cellulitis and abscess of left lower extremity    Rx / DC Orders ED Discharge Orders         Ordered    doxycycline (VIBRAMYCIN) 100 MG capsule  2 times daily     03/18/19 1424    naproxen (NAPROSYN) 500 MG tablet  2 times daily     03/18/19 1424           Lillian Ballester, [place] R, PA-C 03/18/19 1430    Varney Biles, MD 03/18/19 1600

## 2019-03-21 LAB — AEROBIC CULTURE W GRAM STAIN (SUPERFICIAL SPECIMEN)

## 2019-03-21 LAB — AEROBIC CULTURE? (SUPERFICIAL SPECIMEN)

## 2019-03-22 ENCOUNTER — Telehealth: Payer: Self-pay | Admitting: *Deleted

## 2019-03-22 NOTE — Telephone Encounter (Signed)
Post ED Visit - Positive Culture Follow-up  Culture report reviewed by antimicrobial stewardship pharmacist: [place] Team []  Elenor Quinones, Pharm.D. []  Heide Guile, Pharm.D., BCPS AQ-ID []  Parks Neptune, Pharm.D., BCPS []  Alycia Rossetti, Pharm.D., BCPS []  [place], Pharm.D., BCPS, AAHIVP []  Legrand Como, Pharm.D., BCPS, AAHIVP []  Salome Arnt, PharmD, BCPS []  Johnnette Gourd, PharmD, BCPS []  Hughes Better, PharmD, BCPS []  Leeroy Cha, PharmD []  Laqueta Linden, PharmD, BCPS []  Albertina Parr, PharmD  [place] Team []  Leodis Sias, PharmD []  Lindell Spar, PharmD [x]  Royetta Asal, PharmD []  Graylin Shiver, Rph []  Rema Fendt) Glennon Mac, PharmD []  Arlyn Dunning, PharmD []  Netta Cedars, PharmD []  Dia Sitter, PharmD []  Leone Haven, PharmD []  Gretta Arab, PharmD []  Theodis Shove, PharmD []  Peggyann Juba, PharmD []  Reuel Boom, PharmD   Positive wound culture Treated with Doxycycline Hyclate, organism sensitive to the same and no further patient follow-up is required at this time.  Harlon Flor Talley 03/22/2019, 2:20 PM

## 2020-02-27 ENCOUNTER — Ambulatory Visit (HOSPITAL_COMMUNITY)
Admission: EM | Admit: 2020-02-27 | Discharge: 2020-02-27 | Disposition: A | Discharge status: Home / Self Care | Payer: Medicaid Other | Attending: Emergency Medicine | Admitting: Emergency Medicine

## 2020-02-27 ENCOUNTER — Other Ambulatory Visit: Payer: Self-pay

## 2020-02-27 ENCOUNTER — Encounter (HOSPITAL_COMMUNITY): Payer: Self-pay | Admitting: Emergency Medicine

## 2020-02-27 DIAGNOSIS — Z3202 Encounter for pregnancy test, result negative: Secondary | ICD-10-CM

## 2020-02-27 DIAGNOSIS — N939 Abnormal uterine and vaginal bleeding, unspecified: Secondary | ICD-10-CM | POA: Insufficient documentation

## 2020-02-27 DIAGNOSIS — R109 Unspecified abdominal pain: Secondary | ICD-10-CM

## 2020-02-27 DIAGNOSIS — N938 Other specified abnormal uterine and vaginal bleeding: Secondary | ICD-10-CM

## 2020-02-27 LAB — POCT URINALYSIS DIPSTICK, ED / UC
Bilirubin Urine: NEGATIVE
Glucose, UA: NEGATIVE mg/dL
Ketones, ur: NEGATIVE mg/dL
Leukocytes,Ua: NEGATIVE
Nitrite: NEGATIVE
Protein, ur: NEGATIVE mg/dL
Specific Gravity, Urine: 1.025 (ref 1.005–1.030)
Urobilinogen, UA: 0.2 mg/dL (ref 0.0–1.0)
pH: 7 (ref 5.0–8.0)

## 2020-02-27 LAB — POC URINE PREG, ED: Preg Test, Ur: NEGATIVE

## 2020-02-27 MED ORDER — KETOROLAC TROMETHAMINE 30 MG/ML IJ SOLN
30.0000 mg | Freq: Once | INTRAMUSCULAR | Status: AC
Start: 2020-02-27 — End: 2020-02-27
  Administered 2020-02-27: 30 mg via INTRAMUSCULAR

## 2020-02-27 MED ORDER — KETOROLAC TROMETHAMINE 30 MG/ML IJ SOLN
INTRAMUSCULAR | Status: AC
  Filled 2020-02-27: qty 1

## 2020-02-27 NOTE — ED Triage Notes (Signed)
Pt presents with vaginal bleeding and severe abdominal pain xs 1 week.

## 2020-02-27 NOTE — Discharge Instructions (Addendum)
Please make follow-up appointment at [facility] regarding vaginal bleeding. Toradol given in this urgent care encounter will help with your pain. STD testing results will post your MyChart.

## 2020-02-27 NOTE — ED Provider Notes (Signed)
____________________________________________  Time seen: Approximately 10:33 AM  I have reviewed the triage vital signs and the nursing notes.   HISTORY  Chief Complaint Vaginal Bleeding and Abdominal Pain   Historian Patient   HPI Ashley Jennings is a 29 y.o. female presents to the urgent care with concern for irregular vaginal bleeding and cramping for 1 week.  Patient states that she started her menstrual cycle on 11/18 and that her menstrual cycle does not typically last this long.  She has not been under the care of a gynecologist.  She denies possibility of pregnancy.  Denies concerns for STDs.  No low back pain, nausea or vomiting.  Patient states that her pelvic discomfort is secondary to cramping and is not in a specific location.  No dyspareunia.  Patient states that she has had similar discomfort when she had her menstrual cycle in the past.  No fever or chills.  No other alleviating measures have been attempted.   Past Medical History:  Diagnosis Date  . Depression jan 2012   states just short-term when her mom passed away   . Maternal varicella, non-immune 06/01/2011   Immunize postpartum   . No pertinent past medical history   . Pregnancy induced hypertension      Immunizations up to date:  Yes.     Past Medical History:  Diagnosis Date  . Depression jan 2012   states just short-term when her mom passed away   . Maternal varicella, non-immune 06/01/2011   Immunize postpartum   . No pertinent past medical history   . Pregnancy induced hypertension     Patient Active Problem List   Diagnosis Date Noted  . Postpartum examination following vaginal delivery 09/02/2011  . Gonorrhea complicating pregnancy in second trimester 06/17/2011  . Gestational HTN 06/01/2011    Past Surgical History:  Procedure Laterality Date  . DILATION AND CURETTAGE OF UTERUS    . INDUCED ABORTION    . NO PAST SURGERIES      Prior to Admission medications    Medication Sig Start Date End Date Taking? Authorizing Provider  doxycycline (VIBRAMYCIN) 100 MG capsule Take 1 capsule (100 mg total) by mouth 2 (two) times daily. 03/18/19   Petrucelli, Samantha R, PA-C  ibuprofen (ADVIL,MOTRIN) 800 MG tablet Take 1 tablet (800 mg total) by mouth 3 (three) times daily. Patient not taking: Reported on 06/18/2016 03/28/13   Piepenbrink, Anderson Malta, PA-C  lidocaine (XYLOCAINE) 2 % solution Use as directed 20 mLs in the mouth or throat as needed for mouth pain. Patient not taking: Reported on 06/18/2016 03/28/13   Piepenbrink, Anderson Malta, PA-C  naproxen (NAPROSYN) 500 MG tablet Take 1 tablet (500 mg total) by mouth 2 (two) times daily. 03/18/19   Petrucelli, Glynda Jaeger, PA-C    Allergies Patient has no known allergies.  Family History  Problem Relation Age of Onset  . Diabetes Mother   . Kidney disease Mother   . Hypertension Mother   . Anesthesia problems Neg Hx     Social History Social History   Tobacco Use  . Smoking status: Current Some Day Smoker    Types: Cigarettes  . Smokeless tobacco: Never Used  Vaping Use  . Vaping Use: Never used  Substance Use Topics  . Alcohol use: No  . Drug use: No     Review of Systems  Constitutional: No fever/chills Eyes:  No discharge ENT: No upper respiratory complaints. Respiratory: no cough. No SOB/ use of accessory muscles to breath  Gastrointestinal:   No nausea, no vomiting.  No diarrhea.  No constipation. Genitourinary: Patient has vaginal bleeding.  Musculoskeletal: Negative for musculoskeletal pain. Skin: Negative for rash, abrasions, lacerations, ecchymosis.   ____________________________________________   PHYSICAL EXAM:  VITAL SIGNS: ED Triage Vitals  Enc Vitals Group     BP 02/27/20 0948 122/77     Pulse Rate 02/27/20 0948 85     Resp 02/27/20 0948 19     Temp 02/27/20 0948 99.8 F (37.7 C)     Temp Source 02/27/20 0948 Oral     SpO2 02/27/20 0948 98 %     Weight --      Height  --      Head Circumference --      Peak Flow --      Pain Score 02/27/20 0947 9     Pain Loc --      Pain Edu? --      Excl. in [place]? --      Constitutional: Alert and oriented. Well appearing and in no acute distress. Eyes: Conjunctivae are normal. PERRL. EOMI. Head: Atraumatic. Cardiovascular: Normal rate, regular rhythm. Normal S1 and S2.  Good peripheral circulation. Respiratory: Normal respiratory effort without tachypnea or retractions. Lungs CTAB. Good air entry to the bases with no decreased or absent breath sounds Gastrointestinal: Bowel sounds x 4 quadrants. Soft and nontender to palpation. No guarding or rigidity. No distention. Musculoskeletal: Full range of motion to all extremities. No obvious deformities noted Neurologic:  Normal for age. No gross focal neurologic deficits are appreciated.  Skin:  Skin is warm, dry and intact. No rash noted. Psychiatric: Mood and affect are normal for age. Speech and behavior are normal.   ____________________________________________   LABS (all labs ordered are listed, but only abnormal results are displayed)  Labs Reviewed  POCT URINALYSIS DIPSTICK, ED / UC - Abnormal; Notable for the following components:      Result Value   Hgb urine dipstick MODERATE (*)    All other components within normal limits  POC URINE PREG, ED  CERVICOVAGINAL ANCILLARY ONLY   ____________________________________________  EKG   ____________________________________________  RADIOLOGY   No results found.  ____________________________________________    PROCEDURES  Procedure(s) performed:     Procedures     Medications  ketorolac (TORADOL) 30 MG/ML injection 30 mg (30 mg Intramuscular Given 02/27/20 1034)     ____________________________________________   INITIAL IMPRESSION / ASSESSMENT AND PLAN / ED COURSE  Pertinent labs & imaging results that were available during my care of the patient were reviewed by me and considered  in my medical decision making (see chart for details).      Assessment and plan Vaginal bleeding 29 year old female presents to the emergency department with irregular vaginal bleeding for the past 1 week.  Patient states that vaginal bleeding is irregular specifically due to longer duration of symptoms.  Her pelvic cramping is typical for her menses.  Differential diagnosis includes dysmenorrhea, pregnancy, UTI...  Urine pregnancy testing was negative.  Urinalysis reveals a moderate amount of hemoglobin but no other acute abnormalities.  We will refer [facility] for further care and evaluation.  Send off testing for gonorrhea, chlamydia, BV and yeast are in process at this time.  All patient questions were answered.   ____________________________________________  FINAL CLINICAL IMPRESSION(S) / ED DIAGNOSES  Final diagnoses:  Vaginal bleeding      NEW MEDICATIONS STARTED DURING THIS VISIT:  ED Discharge Orders  None          This chart was dictated using voice recognition software/Dragon. Despite best efforts to proofread, errors can occur which can change the meaning. Any change was purely unintentional.     Lannie Fields, PA-C 02/27/20 1044

## 2020-03-03 ENCOUNTER — Telehealth (HOSPITAL_COMMUNITY): Payer: Self-pay | Admitting: Emergency Medicine

## 2020-03-03 LAB — CERVICOVAGINAL ANCILLARY ONLY
Bacterial Vaginitis (gardnerella): POSITIVE — AB
Candida Glabrata: NEGATIVE
Candida Vaginitis: NEGATIVE
Chlamydia: NEGATIVE
Comment: NEGATIVE
Comment: NEGATIVE
Comment: NEGATIVE
Comment: NEGATIVE
Comment: NEGATIVE
Comment: NORMAL
Neisseria Gonorrhea: POSITIVE — AB
Trichomonas: POSITIVE — AB

## 2020-03-03 MED ORDER — METRONIDAZOLE 500 MG PO TABS: 500.0000 mg | ORAL_TABLET | Freq: Two times a day (BID) | ORAL | 0 refills | Status: DC

## 2020-03-20 ENCOUNTER — Other Ambulatory Visit: Payer: Self-pay

## 2020-03-20 ENCOUNTER — Ambulatory Visit (HOSPITAL_COMMUNITY)
Admission: EM | Admit: 2020-03-20 | Discharge: 2020-03-20 | Disposition: A | Discharge status: Home / Self Care | Payer: Medicaid Other | Attending: Family Medicine | Admitting: Family Medicine

## 2020-03-20 DIAGNOSIS — A64 Unspecified sexually transmitted disease: Secondary | ICD-10-CM | POA: Diagnosis not present

## 2020-03-20 MED ORDER — LIDOCAINE HCL (PF) 1 % IJ SOLN
INTRAMUSCULAR | Status: AC
  Filled 2020-03-20: qty 2

## 2020-03-20 MED ORDER — CEFTRIAXONE SODIUM 500 MG IJ SOLR
500.0000 mg | Freq: Once | INTRAMUSCULAR | Status: AC
  Administered 2020-03-20: 15:00:00 500 mg via INTRAMUSCULAR

## 2020-03-20 MED ORDER — CEFTRIAXONE SODIUM 500 MG IJ SOLR
INTRAMUSCULAR | Status: AC
  Filled 2020-03-20: qty 500

## 2020-03-20 NOTE — Discharge Instructions (Addendum)
Take 7 full days of antibiotics Avoid sexual relations until you have completed the week of antibiotics

## 2020-03-20 NOTE — ED Provider Notes (Signed)
Here for STD treatment Chart reviewed   Raylene Everts, MD 03/20/20 1442

## 2020-03-20 NOTE — ED Triage Notes (Signed)
Pt presents for treatment of STD. Pt verbalized understanding and consents to treatment to day.

## 2020-04-05 NOTE — L&D Delivery Note (Addendum)
Obstetrical Delivery Note   Date of Delivery:   02/10/2021 Primary OB:   [facility] Gestational Age/EDD: [redacted]w[redacted]d Reason for Admission: IUFD, vaginal bleeding Antepartum complications: BMI 63A, chronic hypertension, history of gonorrhea  Delivered By:   Durene Romans MD  Delivery Type:   spontaneous vaginal delivery  Delivery Details:   CTSP as she had just delivered when she was moved to antepartum. When I arrived, patient holding baby and cord clamped and cut and approximately 4-5cm of cord was coming out of the vagina. The patient easily delivered the placenta over a few pushes with approximately 379mL of clot during this process. Perineum intact and no bleeding after placental delivery and with fundal massage. Anesthesia:    none Intrapartum complications: None GBS:    Not Available Laceration:    none Episiotomy:    none Rectal exam:   deferred Placenta:    Delivered and expressed via active management. Intact: yes. To pathology: yes. Appears normal. Delayed Cord Clamping: not applicable Estimated Blood Loss:  348mL during delivery process; Triton QBL 449mL Patient had bleeding in OB triage, per outgoing team. Likely total EBL 600-746mL  Baby:    Stillborn female. Will try to obtain weight, but baby looks morphologically normal, appears female and no gross or obvious defects and baby has not been demised for long.   Durene Romans MD Attending Center for [facility])

## 2020-08-11 ENCOUNTER — Encounter (HOSPITAL_COMMUNITY): Payer: Self-pay

## 2020-08-11 ENCOUNTER — Ambulatory Visit (HOSPITAL_COMMUNITY)
Admission: EM | Admit: 2020-08-11 | Discharge: 2020-08-11 | Disposition: A | Discharge status: Home / Self Care | Payer: Medicaid Other | Attending: Internal Medicine | Admitting: Internal Medicine

## 2020-08-11 ENCOUNTER — Other Ambulatory Visit: Payer: Self-pay

## 2020-08-11 DIAGNOSIS — S90851A Superficial foreign body, right foot, initial encounter: Secondary | ICD-10-CM

## 2020-08-11 MED ORDER — BACITRACIN ZINC 500 UNIT/GM EX OINT: 1.0000 "application " | TOPICAL_OINTMENT | Freq: Two times a day (BID) | CUTANEOUS | 0 refills | Status: DC

## 2020-08-11 NOTE — ED Triage Notes (Signed)
Pt reports glass stuck on the bottom of her right foot.

## 2020-08-11 NOTE — Discharge Instructions (Signed)
Please apply antibiotic ointment to the area Please return to urgent care if you have erythema, worsening pain or drainage from the site.

## 2020-08-13 NOTE — ED Provider Notes (Signed)
[facility]    CSN: [id number] Arrival date & time: 08/11/20  1753      History   Chief Complaint Chief Complaint  Patient presents with  . Foreign Body    In foot    HPI Ashley Jennings is a 30 y.o. female comes to the urgent care with a piece of glass stuck in the bottom of her right foot.  She noticed it today.  No bleeding noted.  No swelling of the feet.   HPI  Past Medical History:  Diagnosis Date  . Depression jan 2012   states just short-term when her mom passed away   . Maternal varicella, non-immune 06/01/2011   Immunize postpartum   . No pertinent past medical history   . Pregnancy induced hypertension     Patient Active Problem List   Diagnosis Date Noted  . Postpartum examination following vaginal delivery 09/02/2011  . Gonorrhea complicating pregnancy in second trimester 06/17/2011  . Gestational HTN 06/01/2011    Past Surgical History:  Procedure Laterality Date  . DILATION AND CURETTAGE OF UTERUS    . INDUCED ABORTION    . NO PAST SURGERIES      OB History    Gravida  1   Para  1   Term  1   Preterm  0   AB  0   Living  1     SAB  0   IAB  0   Ectopic  0   Multiple  0   Live Births  1            Home Medications    Prior to Admission medications   Medication Sig Start Date End Date Taking? Authorizing Provider  bacitracin ointment Apply 1 application topically 2 (two) times daily. 08/11/20  Yes Shams Fill, Myrene Galas, MD  doxycycline (VIBRAMYCIN) 100 MG capsule Take 1 capsule (100 mg total) by mouth 2 (two) times daily. 03/18/19   Petrucelli, Samantha R, PA-C  metroNIDAZOLE (FLAGYL) 500 MG tablet Take 1 tablet (500 mg total) by mouth 2 (two) times daily. 03/03/20   Krzysztof Reichelt, Myrene Galas, MD  naproxen (NAPROSYN) 500 MG tablet Take 1 tablet (500 mg total) by mouth 2 (two) times daily. 03/18/19   Petrucelli, Glynda Jaeger, PA-C    Family History Family History  Problem Relation Age of Onset  . Diabetes Mother   .  Kidney disease Mother   . Hypertension Mother   . Anesthesia problems Neg Hx     Social History Social History   Tobacco Use  . Smoking status: Current Some Day Smoker    Types: Cigarettes  . Smokeless tobacco: Never Used  Vaping Use  . Vaping Use: Never used  Substance Use Topics  . Alcohol use: No  . Drug use: No     Allergies   Patient has no known allergies.   Review of Systems Review of Systems  Musculoskeletal: Negative.   Skin: Positive for wound.     Physical Exam Triage Vital Signs ED Triage Vitals  Enc Vitals Group     BP 08/11/20 1902 (!) 158/104     Pulse Rate 08/11/20 1902 64     Resp 08/11/20 1902 20     Temp 08/11/20 1902 97.9 F (36.6 C)     Temp Source 08/11/20 1902 Oral     SpO2 08/11/20 1902 100 %     Weight --      Height --      Head Circumference --  Peak Flow --      Pain Score 08/11/20 1901 5     Pain Loc --      Pain Edu? --      Excl. in [place]? --    No data found.  Updated Vital Signs BP (!) 158/104 (BP Location: Left Arm)   Pulse 64   Temp 97.9 F (36.6 C) (Oral)   Resp 20   LMP 08/03/2020 (Exact Date)   SpO2 100%   Visual Acuity Right Eye Distance:   Left Eye Distance:   Bilateral Distance:    Right Eye Near:   Left Eye Near:    Bilateral Near:     Physical Exam Musculoskeletal:        General: Normal range of motion.     Comments: Foreign body at the base of the right great toe.      UC Treatments / Results  Labs (all labs ordered are listed, but only abnormal results are displayed) Labs Reviewed - No data to display  EKG   Radiology No results found.  Procedures Foreign Body Removal  Date/Time: 08/13/2020 5:02 PM Performed by: Chase Picket, MD Authorized by: Chase Picket, MD   Consent:    Consent obtained:  Verbal   Consent given by:  Patient   Risks discussed:  Bleeding and infection Universal protocol:    Patient identity confirmed:  Verbally with patient Location:     Location:  Foot   Foot location:  R sole   Tendon involvement:  None Pre-procedure details:    Imaging:  None Anesthesia:    Anesthesia method:  Local infiltration   Local anesthetic:  Lidocaine 1% w/o epi Procedure type:    Procedure complexity:  Simple Procedure details:    Localization method:  Finder needle   Removal mechanism:  Hemostat and forceps   Foreign bodies recovered:  1   Description:  1 Post-procedure details:    Neurovascular status: intact     Confirmation:  No additional foreign bodies on visualization and complete removal verified with imaging   Procedure completion:  Tolerated well, no immediate complications   (including critical care time)  Medications Ordered in UC Medications - No data to display  Initial Impression / Assessment and Plan / UC Course  I have reviewed the triage vital signs and the nursing notes.  Pertinent labs & imaging results that were available during my care of the patient were reviewed by me and considered in my medical decision making (see chart for details).     1.  Foreign body removal from the right foot: Foreign body removed Antibiotic ointment Return to urgent care if you have worsening, pain swelling and drainage from the sole of the right foot. Final Clinical Impressions(s) / UC Diagnoses   Final diagnoses:  Foreign body in right foot, initial encounter     Discharge Instructions     Please apply antibiotic ointment to the area Please return to urgent care if you have erythema, worsening pain or drainage from the site.   ED Prescriptions    Medication Sig Dispense Auth. Provider   bacitracin ointment Apply 1 application topically 2 (two) times daily. 120 g Neithan Day, Myrene Galas, MD     PDMP not reviewed this encounter.   Chase Picket, MD 08/13/20 1704

## 2021-01-12 ENCOUNTER — Other Ambulatory Visit: Payer: Self-pay

## 2021-01-12 ENCOUNTER — Ambulatory Visit (INDEPENDENT_AMBULATORY_CARE_PROVIDER_SITE_OTHER): Payer: Medicaid Other

## 2021-01-12 ENCOUNTER — Other Ambulatory Visit (HOSPITAL_COMMUNITY)
Admission: RE | Admit: 2021-01-12 | Discharge: 2021-01-12 | Disposition: A | Discharge status: Home / Self Care | Payer: Medicaid Other | Source: Ambulatory Visit | Attending: Family Medicine | Admitting: Family Medicine

## 2021-01-12 VITALS — BP 145/86 | HR 70 | Ht 69.0 in | Wt 248.4 lb

## 2021-01-12 DIAGNOSIS — Z3201 Encounter for pregnancy test, result positive: Secondary | ICD-10-CM | POA: Diagnosis not present

## 2021-01-12 DIAGNOSIS — O0990 Supervision of high risk pregnancy, unspecified, unspecified trimester: Secondary | ICD-10-CM

## 2021-01-12 DIAGNOSIS — N898 Other specified noninflammatory disorders of vagina: Secondary | ICD-10-CM | POA: Diagnosis not present

## 2021-01-12 LAB — POCT PREGNANCY, URINE: Preg Test, Ur: POSITIVE — AB

## 2021-01-12 MED ORDER — PREPLUS 27-1 MG PO TABS: 1.0000 | ORAL_TABLET | Freq: Every day | ORAL | 6 refills | Status: AC

## 2021-01-12 NOTE — Progress Notes (Signed)
Pt dropped off urine today for UPT. UPT is positive.  LMP 09/03/20. EDD 06/10/21 [redacted]w[redacted]d FHR 145, +FM  Pt denies any vaginal bleeding, abd pain or cramps at this time. Pt advised to start taking PNV and to call OB provider for next OB appt. Pt verbalized understanding and agreeable to plan of care.   Pt states has vaginal discharge with odor and itching x 1 month. Has not used OTC meds for treatment.  Self swab collected today. Pt advised results will take 24-48 hours and will see results in mychart and will be notified if needs further treatment. Pt verbalized understanding.   Colletta Maryland, RN    New OB US scheduled for 01/15/2021@ 1230 pm   due to Late [facility], Hx of GHTN with past pregnancy.   Colletta Maryland, RN

## 2021-01-12 NOTE — Patient Instructions (Addendum)
Prenatal Care Providers           [facility] [place] @ [facility]  [place] [phone]  [facility] [place] @ [place]  [phone]  [place] @ [facility]       [address] [phone]            [facility] [place] @ [place]     [phone]          [facility] [place] @ [facility]   [phone]  [facility] [place] @ [place] [phone]     [facility] [place] @ [place] ([place])  [place]   [phone]     [place] Department  Phone: [phone]   Safe Medications in Pregnancy   Acne:  Benzoyl Peroxide  Salicylic Acid   Backache/Headache:  Tylenol: 2 regular strength every 4 hours OR               2 Extra strength every 6 hours   Colds/Coughs/Allergies:  Benadryl (alcohol free) 25 mg every 6 hours as needed  Breath right strips  Claritin  Cepacol throat lozenges  Chloraseptic throat spray  Cold-Eeze- up to three times per day  Cough drops, alcohol free  Flonase (by prescription only)  Guaifenesin  Mucinex  Robitussin DM (plain only, alcohol free)  Saline nasal spray/drops  Sudafed (pseudoephedrine) & Actifed * use only after [redacted] weeks gestation and if you do not have high blood pressure  Tylenol  Vicks Vaporub  Zinc lozenges  Zyrtec   Constipation:  Colace  Ducolax suppositories  Fleet enema  Glycerin suppositories  Metamucil  Milk of magnesia  Miralax  Senokot  Smooth move tea   Diarrhea:  Kaopectate  Imodium A-D   *NO pepto Bismol   Hemorrhoids:  Anusol  Anusol HC  Preparation H  Tucks   Indigestion:  Tums  Maalox  Mylanta  Zantac  Pepcid   Insomnia:  Benadryl (alcohol free) 25mg  every 6 hours as needed  Tylenol PM  Unisom, no Gelcaps   Leg Cramps:  Tums   MagGel   Nausea/Vomiting:  Bonine  Dramamine  Emetrol  Ginger extract  Sea bands  Meclizine  Nausea medication to take during pregnancy:  Unisom (doxylamine succinate 25 mg tablets) Take one tablet daily at bedtime. If symptoms are not adequately controlled, the dose can be increased to a maximum recommended dose of two tablets daily (1/2 tablet in the morning, 1/2 tablet mid-afternoon and one at bedtime).  Vitamin B6 100mg  tablets. Take one tablet twice a day (up to 200 mg per day).   Skin Rashes:  Aveeno products  Benadryl cream or 25mg  every 6 hours as needed  Calamine Lotion  1% cortisone cream   Yeast infection:  Gyne-lotrimin 7  Monistat 7    **If taking multiple medications, please check labels to avoid duplicating the same active ingredients  **take medication as directed on the label  ** Do not exceed 4000 mg of tylenol in 24 hours  **Do not take medications that contain aspirin or ibuprofen

## 2021-01-12 NOTE — Progress Notes (Signed)
Patient was assessed and managed by nursing staff during this encounter. I have reviewed the chart and agree with the documentation and plan. I have also made any necessary editorial changes.  Griffin Basil, MD 01/12/2021 2:47 PM

## 2021-01-13 ENCOUNTER — Ambulatory Visit: Payer: Medicaid Other

## 2021-01-13 LAB — CERVICOVAGINAL ANCILLARY ONLY
Bacterial Vaginitis (gardnerella): POSITIVE — AB
Candida Glabrata: NEGATIVE
Candida Vaginitis: NEGATIVE
Chlamydia: NEGATIVE
Comment: NEGATIVE
Comment: NEGATIVE
Comment: NEGATIVE
Comment: NEGATIVE
Comment: NEGATIVE
Comment: NORMAL
Neisseria Gonorrhea: POSITIVE — AB
Trichomonas: POSITIVE — AB

## 2021-01-15 ENCOUNTER — Other Ambulatory Visit: Payer: Self-pay | Admitting: Obstetrics & Gynecology

## 2021-01-15 ENCOUNTER — Telehealth: Payer: Self-pay | Admitting: *Deleted

## 2021-01-15 ENCOUNTER — Ambulatory Visit: Payer: Medicaid Other | Admitting: *Deleted

## 2021-01-15 ENCOUNTER — Ambulatory Visit: Payer: Medicaid Other | Attending: Obstetrics & Gynecology

## 2021-01-15 ENCOUNTER — Other Ambulatory Visit: Payer: Self-pay | Admitting: *Deleted

## 2021-01-15 ENCOUNTER — Encounter: Payer: Self-pay | Admitting: *Deleted

## 2021-01-15 ENCOUNTER — Other Ambulatory Visit: Payer: Self-pay

## 2021-01-15 ENCOUNTER — Telehealth: Payer: Self-pay

## 2021-01-15 VITALS — BP 128/66 | HR 71

## 2021-01-15 DIAGNOSIS — A64 Unspecified sexually transmitted disease: Secondary | ICD-10-CM | POA: Insufficient documentation

## 2021-01-15 DIAGNOSIS — O0990 Supervision of high risk pregnancy, unspecified, unspecified trimester: Secondary | ICD-10-CM | POA: Insufficient documentation

## 2021-01-15 DIAGNOSIS — E669 Obesity, unspecified: Secondary | ICD-10-CM

## 2021-01-15 DIAGNOSIS — Z3A15 15 weeks gestation of pregnancy: Secondary | ICD-10-CM

## 2021-01-15 DIAGNOSIS — Z3689 Encounter for other specified antenatal screening: Secondary | ICD-10-CM | POA: Diagnosis not present

## 2021-01-15 DIAGNOSIS — Z6832 Body mass index (BMI) 32.0-32.9, adult: Secondary | ICD-10-CM

## 2021-01-15 DIAGNOSIS — Z8759 Personal history of other complications of pregnancy, childbirth and the puerperium: Secondary | ICD-10-CM

## 2021-01-15 DIAGNOSIS — O0932 Supervision of pregnancy with insufficient antenatal care, second trimester: Secondary | ICD-10-CM

## 2021-01-15 DIAGNOSIS — O98319 Other infections with a predominantly sexual mode of transmission complicating pregnancy, unspecified trimester: Secondary | ICD-10-CM | POA: Insufficient documentation

## 2021-01-15 DIAGNOSIS — O99212 Obesity complicating pregnancy, second trimester: Secondary | ICD-10-CM | POA: Insufficient documentation

## 2021-01-15 DIAGNOSIS — Z3492 Encounter for supervision of normal pregnancy, unspecified, second trimester: Secondary | ICD-10-CM

## 2021-01-15 DIAGNOSIS — O99332 Smoking (tobacco) complicating pregnancy, second trimester: Secondary | ICD-10-CM

## 2021-01-15 DIAGNOSIS — O0992 Supervision of high risk pregnancy, unspecified, second trimester: Secondary | ICD-10-CM | POA: Diagnosis not present

## 2021-01-15 DIAGNOSIS — D219 Benign neoplasm of connective and other soft tissue, unspecified: Secondary | ICD-10-CM

## 2021-01-15 DIAGNOSIS — Z362 Encounter for other antenatal screening follow-up: Secondary | ICD-10-CM

## 2021-01-15 MED ORDER — METRONIDAZOLE 500 MG PO TABS: 500.0000 mg | ORAL_TABLET | Freq: Two times a day (BID) | ORAL | 0 refills | Status: DC

## 2021-01-15 NOTE — Telephone Encounter (Signed)
Call received from pt. Pt given results and recommendations per Dr Elgie Congo. Pt verbalized understanding. Pt sent in Rx Flagyl for trich and BV. Pt has nurse visit at [facility] for Gonorrhea tx on 10/14 at 920am. Pt agreeable to date and time of appt.   Colletta Maryland, RN

## 2021-01-15 NOTE — Telephone Encounter (Signed)
Pt called to this office with questions about Rx. Pt was seen at [facility]- message will be routed to that office.

## 2021-01-15 NOTE — Telephone Encounter (Signed)
-----   Message from Griffin Basil, MD sent at 01/14/2021  8:20 AM EDT ----- Gonorrhea, trich and BV noted, will offer treatment.  Pt will need TOC in 6-8 weeks

## 2021-01-15 NOTE — Telephone Encounter (Signed)
Call placed to pt. No answer. Left VM for pt to return call to office for results and Rx.  Faxed Communication to [facility] today.  Will try again tomorrow  Colletta Maryland, RN

## 2021-01-16 ENCOUNTER — Ambulatory Visit (INDEPENDENT_AMBULATORY_CARE_PROVIDER_SITE_OTHER): Payer: Medicaid Other

## 2021-01-16 VITALS — BP 133/68 | HR 68 | Ht 69.0 in | Wt 246.9 lb

## 2021-01-16 DIAGNOSIS — A64 Unspecified sexually transmitted disease: Secondary | ICD-10-CM

## 2021-01-16 DIAGNOSIS — A549 Gonococcal infection, unspecified: Secondary | ICD-10-CM | POA: Diagnosis not present

## 2021-01-16 DIAGNOSIS — O98319 Other infections with a predominantly sexual mode of transmission complicating pregnancy, unspecified trimester: Secondary | ICD-10-CM

## 2021-01-16 DIAGNOSIS — Z3480 Encounter for supervision of other normal pregnancy, unspecified trimester: Secondary | ICD-10-CM | POA: Insufficient documentation

## 2021-01-16 MED ORDER — CEFTRIAXONE SODIUM 500 MG IJ SOLR
500.0000 mg | Freq: Once | INTRAMUSCULAR | Status: AC
  Administered 2021-01-16: 500 mg via INTRAMUSCULAR

## 2021-01-16 NOTE — Progress Notes (Signed)
Pt here today for Gonorrhea treatment. Pt was positive with swab on 10/10. Pt is [facility] pt. Pt aware of follow ups at [facility] for [facility] care.  Pt given Rocephin 500mg  x 1 IM per protocol. Pt tolerated well.   Colletta Maryland, RN

## 2021-01-19 ENCOUNTER — Other Ambulatory Visit (HOSPITAL_COMMUNITY)
Admission: RE | Admit: 2021-01-19 | Discharge: 2021-01-19 | Disposition: A | Discharge status: Home / Self Care | Payer: Medicaid Other | Source: Ambulatory Visit | Attending: Obstetrics and Gynecology | Admitting: Obstetrics and Gynecology

## 2021-01-19 ENCOUNTER — Other Ambulatory Visit: Payer: Self-pay

## 2021-01-19 ENCOUNTER — Ambulatory Visit (INDEPENDENT_AMBULATORY_CARE_PROVIDER_SITE_OTHER): Payer: Medicaid Other | Admitting: Obstetrics and Gynecology

## 2021-01-19 ENCOUNTER — Encounter: Payer: Self-pay | Admitting: Obstetrics and Gynecology

## 2021-01-19 DIAGNOSIS — Z3481 Encounter for supervision of other normal pregnancy, first trimester: Secondary | ICD-10-CM

## 2021-01-19 DIAGNOSIS — Z3480 Encounter for supervision of other normal pregnancy, unspecified trimester: Secondary | ICD-10-CM | POA: Insufficient documentation

## 2021-01-19 DIAGNOSIS — Z3A15 15 weeks gestation of pregnancy: Secondary | ICD-10-CM

## 2021-01-19 DIAGNOSIS — O09299 Supervision of pregnancy with other poor reproductive or obstetric history, unspecified trimester: Secondary | ICD-10-CM

## 2021-01-19 DIAGNOSIS — O99210 Obesity complicating pregnancy, unspecified trimester: Secondary | ICD-10-CM

## 2021-01-19 MED ORDER — ASPIRIN EC 81 MG PO TBEC: 81.0000 mg | DELAYED_RELEASE_TABLET | Freq: Every day | ORAL | 2 refills | Status: DC

## 2021-01-19 NOTE — Progress Notes (Signed)
Patient was assessed and managed by nursing staff during this encounter. I have reviewed the chart and agree with the documentation and plan. I have also made any necessary editorial changes.  Aletha Halim, MD 01/19/2021 12:03 AM

## 2021-01-19 NOTE — Progress Notes (Signed)
  Subjective:    Ashley Jennings is a [id number] [redacted]w[redacted]d being seen today for her first obstetrical visit.  Her obstetrical history is significant for obesity and pre-eclampsia. Patient does intend to breast feed. Pregnancy history fully reviewed.  Patient reports no complaints.  Vitals:   01/19/21 1112  BP: 121/73  Pulse: 67  Weight: 249 lb (112.9 kg)    HISTORY: OB History  Gravida Para Term Preterm AB Living  4 1 1  0 2 1  SAB IAB Ectopic Multiple Live Births  0 2 0 0 1    # Outcome Date GA Lbr Len/2nd Weight Sex Delivery Anes PTL Lv  4 Current           3 Term 08/01/11 [redacted]w[redacted]d 29:32 / 02:41 7 lb 3.3 oz (3.27 kg) F Vag-Vacuum EPI  LIV     Birth Comments: caput  2 IAB           1 IAB            Past Medical History:  Diagnosis Date   Depression jan 2012   states just short-term when her mom passed away    Maternal varicella, non-immune 06/01/2011   Immunize postpartum    No pertinent past medical history    Pregnancy induced hypertension    Past Surgical History:  Procedure Laterality Date   DILATION AND CURETTAGE OF UTERUS     INDUCED ABORTION     NO PAST SURGERIES     Family History  Problem Relation Age of Onset   Diabetes Mother    Kidney disease Mother    Hypertension Mother    Anesthesia problems Neg Hx      Exam    Uterus:   16-weeks  Pelvic Exam:    Perineum: Normal Perineum   Vulva: normal   Vagina:  normal mucosa, normal discharge   pH:    Cervix: multiparous appearance and closed and long   Adnexa: no mass, fullness, tenderness   Bony Pelvis: gynecoid  System: Breast:  normal appearance, no masses or tenderness   Skin: normal coloration and turgor, no rashes    Neurologic: oriented, no focal deficits   Extremities: normal strength, tone, and muscle mass   HEENT extra ocular movement intact   Mouth/Teeth mucous membranes moist, pharynx normal without lesions and dental hygiene good   Neck supple and no masses   Cardiovascular: regular  rate and rhythm   Respiratory:  appears well, vitals normal, no respiratory distress, acyanotic, normal RR, chest clear, no wheezing, crepitations, rhonchi, normal symmetric air entry   Abdomen: soft, non-tender; bowel sounds normal; no masses,  no organomegaly   Urinary:       Assessment:    Pregnancy: [id number] Patient Active Problem List   Diagnosis Date Noted   Hx of preeclampsia, prior pregnancy, currently pregnant 01/19/2021   Maternal obesity affecting pregnancy, antepartum 01/19/2021   Supervision of other normal pregnancy, antepartum 01/16/2021   Sexually transmitted disease complicating pregnancy, antepartum 01/15/2021        Plan:     Initial labs drawn. Prenatal vitamins. Problem list reviewed and updated. Genetic Screening discussed : panorama and AFP collected.  Ultrasound discussed; fetal survey: ordered. A1C ordered Rx ASA provided Test of cure next visit  Follow up in 4 weeks. 50% of 30 min visit spent on counseling and coordination of care.     Ashley Jennings 01/19/2021

## 2021-01-19 NOTE — Patient Instructions (Signed)
Preeclampsia and Eclampsia Preeclampsia is a serious condition that may develop during pregnancy. This condition involves high blood pressure during pregnancy and causes symptoms such as headaches, vision changes, and increased swelling in the legs, hands, and face. Preeclampsia occurs after 20 weeks of pregnancy. Eclampsia is a seizure that happens from worsening preeclampsia. Diagnosing and managing preeclampsia early is important. If not treated early, it can cause serious problems for mother and baby. There is no cure for this condition. However, during pregnancy, delivering the baby may be the best treatment for preeclampsia or eclampsia. For most women, symptoms of preeclampsia and eclampsia go away after giving birth. In rare cases, a woman may develop preeclampsia or eclampsia after giving birth. This usually occurs within 48 hours after childbirth but may occur up to 6 weeks after giving birth. What are the causes? The cause of this condition is not known. What increases the risk? The following factors make you more likely to develop preeclampsia: Being pregnant for the first time or being pregnant with multiples. Having had preeclampsia or a condition called hemolysis, elevated liver enzymes, and low platelet count (HELLP)syndrome during a past pregnancy. Having a family history of preeclampsia. Being older than age 35. Being obese. Becoming pregnant through fertility treatments. Conditions that reduce blood flow or oxygen to your placenta and baby may also increase your risk. These include: High blood pressure before, during, or immediately following pregnancy. Kidney disease. Diabetes. Blood clotting disorders. Autoimmune diseases, such as lupus. Sleep apnea. What are the signs or symptoms? Common symptoms of this condition include: A severe, throbbing headache that does not go away. Vision problems, such as blurred or double vision and light sensitivity. Pain in the stomach,  especially the right upper region. Pain in the shoulder. Other symptoms that may develop as the condition gets worse include: Sudden weight gain because of fluid buildup in the body. This causes swelling of the face, hands, legs, and feet. Severe nausea and vomiting. Urinating less than usual. Shortness of breath. Seizures. How is this diagnosed? Your health care provider will ask you about symptoms and check for signs of preeclampsia during your prenatal visits. You will also have routine tests, including: Checking your blood pressure. Urine tests to check for protein. Blood tests to assess your organ function. Monitoring your baby's heart rate. Ultrasounds to check fetal growth. How is this treated? You and your health care provider will determine the treatment that is best for you. Treatment may include: Frequent prenatal visits to check for preeclampsia. Medicine to lower your blood pressure. Medicine to prevent seizures. Low-dose aspirin during your pregnancy. Staying in the hospital, in severe cases. You will be given medicines to control your blood pressure and the amount of fluids in your body. Delivering your baby. Work with your health care provider to manage any chronic health conditions, such as diabetes or kidney problems. Also, work with your health care provider to manage weight gain during pregnancy. Follow these instructions at home: Eating and drinking Drink enough fluid to keep your urine pale yellow. Avoid caffeine. Caffeine may increase blood pressure and heart rate and lead to dehydration. Reduce the amount of salt that you eat. Lifestyle Do not use any products that contain nicotine or tobacco. These products include cigarettes, chewing tobacco, and vaping devices, such as e-cigarettes. If you need help quitting, ask your health care provider. Do not use alcohol or drugs. Avoid stress as much as possible. Rest and get plenty of sleep. General  instructions  Take   over-the-counter and prescription medicines only as told by your health care provider. When lying down, lie on your left side. This keeps pressure off your major blood vessels. When sitting or lying down, raise (elevate) your feet. Try putting pillows underneath your lower legs. Exercise regularly. Ask your health care provider what kinds of exercise are best for you. Check your blood pressure as often as recommended by your health care provider. Keep all prenatal and follow-up visits. This is important. Contact a health care provider if: You have symptoms that may need treatment or closer monitoring. These include: Headaches. Stomach pain or nausea and vomiting. Shoulder pain. Vision problems, such as spots in front of your eyes or blurry vision. Sudden weight gain or increased swelling in your face, hands, legs, and feet. Increased anxiety or feeling of impending doom. Signs or symptoms of labor. Get help right away if: You have any of the following symptoms: A seizure. Shortness of breath or trouble breathing. Trouble speaking or slurred speech. Fainting. Chest pain. These symptoms may represent a serious problem that is an emergency. Do not wait to see if the symptoms will go away. Get medical help right away. Call your local emergency services (911 in the [place] Do not drive yourself to the hospital. Summary Preeclampsia is a serious condition that may develop during pregnancy. Diagnosing and treating preeclampsia early is very important. Keep all prenatal and follow-up visits. This is important. Get help right away if you have a seizure, shortness of breath or trouble breathing, trouble speaking or slurred speech, chest pain, or fainting. This information is not intended to replace advice given to you by your health care provider. Make sure you discuss any questions you have with your health care provider. Document Revised: 12/13/2019 Document Reviewed:  12/13/2019 Elsevier Patient Education  2022 [place] of Pregnancy The second trimester of pregnancy is from week 13 through week 27. This is months 4 through 6 of pregnancy. The second trimester is often a time when you feel your best. Your body has adjusted to being pregnant, and you begin to feel better physically. During the second trimester: Morning sickness has lessened or stopped completely. You may have more energy. You may have an increase in appetite. The second trimester is also a time when the unborn baby (fetus) is growing rapidly. At the end of the sixth month, the fetus may be up to 12 inches long and weigh about 1 pounds. You will likely begin to feel the baby move (quickening) between 16 and 20 weeks of pregnancy. Body changes during your second trimester Your body continues to go through many changes during your second trimester. The changes vary and generally return to normal after the baby is born. Physical changes Your weight will continue to increase. You will notice your lower abdomen bulging out. You may begin to get stretch marks on your hips, abdomen, and breasts. Your breasts will continue to grow and to become tender. Dark spots or blotches (chloasma or mask of pregnancy) may develop on your face. A dark line from your belly button to the pubic area (linea nigra) may appear. You may have changes in your hair. These can include thickening of your hair, rapid growth, and changes in texture. Some people also have hair loss during or after pregnancy, or hair that feels dry or thin. Health changes You may develop headaches. You may have heartburn. You may develop constipation. You may develop hemorrhoids or swollen, bulging veins (varicose veins). Your  gums may bleed and may be sensitive to brushing and flossing. You may urinate more often because the fetus is pressing on your bladder. You may have back pain. This is caused by: Weight  gain. Pregnancy hormones that are relaxing the joints in your pelvis. A shift in weight and the muscles that support your balance. Follow these instructions at home: Medicines Follow your health care provider's instructions regarding medicine use. Specific medicines may be either safe or unsafe to take during pregnancy. Do not take any medicines unless approved by your health care provider. Take a prenatal vitamin that contains at least 600 micrograms (mcg) of folic acid. Eating and drinking Eat a healthy diet that includes fresh fruits and vegetables, whole grains, good sources of protein such as meat, eggs, or tofu, and low-fat dairy products. Avoid raw meat and unpasteurized juice, milk, and cheese. These carry germs that can harm you and your baby. You may need to take these actions to prevent or treat constipation: Drink enough fluid to keep your urine pale yellow. Eat foods that are high in fiber, such as beans, whole grains, and fresh fruits and vegetables. Limit foods that are high in fat and processed sugars, such as fried or sweet foods. Activity Exercise only as directed by your health care provider. Most people can continue their usual exercise routine during pregnancy. Try to exercise for 30 minutes at least 5 days a week. Stop exercising if you develop contractions in your uterus. Stop exercising if you develop pain or cramping in the lower abdomen or lower back. Avoid exercising if it is very hot or humid or if you are at a high altitude. Avoid heavy lifting. If you choose to, you may have sex unless your health care provider tells you not to. Relieving pain and discomfort Wear a supportive bra to prevent discomfort from breast tenderness. Take warm sitz baths to soothe any pain or discomfort caused by hemorrhoids. Use hemorrhoid cream if your health care provider approves. Rest with your legs raised (elevated) if you have leg cramps or low back pain. If you develop varicose  veins: Wear support hose as told by your health care provider. Elevate your feet for 15 minutes, 3-4 times a day. Limit salt in your diet. Safety Wear your seat belt at all times when driving or riding in a car. Talk with your health care provider if someone is verbally or physically abusive to you. Lifestyle Do not use hot tubs, steam rooms, or saunas. Do not douche. Do not use tampons or scented sanitary pads. Avoid cat litter boxes and soil used by cats. These carry germs that can cause birth defects in the baby and possibly loss of the fetus by miscarriage or stillbirth. Do not use herbal remedies, alcohol, illegal drugs, or medicines that are not approved by your health care provider. Chemicals in these products can harm your baby. Do not use any products that contain nicotine or tobacco, such as cigarettes, e-cigarettes, and chewing tobacco. If you need help quitting, ask your health care provider. General instructions During a routine prenatal visit, your health care provider will do a physical exam and other tests. He or she will also discuss your overall health. Keep all follow-up visits. This is important. Ask your health care provider for a referral to a local prenatal education class. Ask for help if you have counseling or nutritional needs during pregnancy. Your health care provider can offer advice or refer you to specialists for help with various needs.  Where to find more information American Pregnancy Association: americanpregnancy.[place] and Gynecologists: PoolDevices.com.pt Office on [employer]: KeywordPortfolios.com.br Contact a health care provider if you have: A headache that does not go away when you take medicine. Vision changes or you see spots in front of your eyes. Mild pelvic cramps, pelvic pressure, or nagging pain in the abdominal area. Persistent nausea, vomiting, or diarrhea. A bad-smelling vaginal  discharge or foul-smelling urine. Pain when you urinate. Sudden or extreme swelling of your face, hands, ankles, feet, or legs. A fever. Get help right away if you: Have fluid leaking from your vagina. Have spotting or bleeding from your vagina. Have severe abdominal cramping or pain. Have difficulty breathing. Have chest pain. Have fainting spells. Have not felt your baby move for the time period told by your health care provider. Have new or increased pain, swelling, or redness in an arm or leg. Summary The second trimester of pregnancy is from week 13 through week 27 (months 4 through 6). Do not use herbal remedies, alcohol, illegal drugs, or medicines that are not approved by your health care provider. Chemicals in these products can harm your baby. Exercise only as directed by your health care provider. Most people can continue their usual exercise routine during pregnancy. Keep all follow-up visits. This is important. This information is not intended to replace advice given to you by your health care provider. Make sure you discuss any questions you have with your health care provider. Document Revised: 08/29/2019 Document Reviewed: 07/05/2019 Elsevier Patient Education  2022 [employer].  Contraception Choices Contraception, also called birth control, refers to methods or devices that prevent pregnancy. Hormonal methods Contraceptive implant A contraceptive implant is a thin, plastic tube that contains a hormone that prevents pregnancy. It is different from an intrauterine device (IUD). It is inserted into the upper part of the arm by a health care provider. Implants can be effective for up to 3 years. Progestin-only injections Progestin-only injections are injections of progestin, a synthetic form of the hormone progesterone. They are given every 3 months by a health care provider. Birth control pills Birth control pills are pills that contain hormones that prevent pregnancy.  They must be taken once a day, preferably at the same time each day. A prescription is needed to use this method of contraception. Birth control patch The birth control patch contains hormones that prevent pregnancy. It is placed on the skin and must be changed once a week for three weeks and removed on the fourth week. A prescription is needed to use this method of contraception. Vaginal ring A vaginal ring contains hormones that prevent pregnancy. It is placed in the vagina for three weeks and removed on the fourth week. After that, the process is repeated with a new ring. A prescription is needed to use this method of contraception. Emergency contraceptive Emergency contraceptives prevent pregnancy after unprotected sex. They come in pill form and can be taken up to 5 days after sex. They work best the sooner they are taken after having sex. Most emergency contraceptives are available without a prescription. This method should not be used as your only form of birth control. Barrier methods Female condom A female condom is a thin sheath that is worn over the penis during sex. Condoms keep sperm from going inside a woman's body. They can be used with a sperm-killing substance (spermicide) to increase their effectiveness. They should be thrown away after one use. Female condom A female condom  is a soft, loose-fitting sheath that is put into the vagina before sex. The condom keeps sperm from going inside a woman's body. They should be thrown away after one use. Diaphragm A diaphragm is a soft, dome-shaped barrier. It is inserted into the vagina before sex, along with a spermicide. The diaphragm blocks sperm from entering the uterus, and the spermicide kills sperm. A diaphragm should be left in the vagina for 6-8 hours after sex and removed within 24 hours. A diaphragm is prescribed and fitted by a health care provider. A diaphragm should be replaced every 1-2 years, after giving birth, after gaining more  than 15 lb (6.8 kg), and after pelvic surgery. Cervical cap A cervical cap is a round, soft latex or plastic cup that fits over the cervix. It is inserted into the vagina before sex, along with spermicide. It blocks sperm from entering the uterus. The cap should be left in place for 6-8 hours after sex and removed within 48 hours. A cervical cap must be prescribed and fitted by a health care provider. It should be replaced every 2 years. Sponge A sponge is a soft, circular piece of polyurethane foam with spermicide in it. The sponge helps block sperm from entering the uterus, and the spermicide kills sperm. To use it, you make it wet and then insert it into the vagina. It should be inserted before sex, left in for at least 6 hours after sex, and removed and thrown away within 30 hours. Spermicides Spermicides are chemicals that kill or block sperm from entering the cervix and uterus. They can come as a cream, jelly, suppository, foam, or tablet. A spermicide should be inserted into the vagina with an applicator at least 11-94 minutes before sex to allow time for it to work. The process must be repeated every time you have sex. Spermicides do not require a prescription. Intrauterine contraception Intrauterine device (IUD) An IUD is a T-shaped device that is put in a woman's uterus. There are two types: Hormone IUD.This type contains progestin, a synthetic form of the hormone progesterone. This type can stay in place for 3-5 years. Copper IUD.This type is wrapped in copper wire. It can stay in place for 10 years. Permanent methods of contraception Female tubal ligation In this method, a woman's fallopian tubes are sealed, tied, or blocked during surgery to prevent eggs from traveling to the uterus. Hysteroscopic sterilization In this method, a small, flexible insert is placed into each fallopian tube. The inserts cause scar tissue to form in the fallopian tubes and block them, so sperm cannot reach an  egg. The procedure takes about 3 months to be effective. Another form of birth control must be used during those 3 months. Female sterilization This is a procedure to tie off the tubes that carry sperm (vasectomy). After the procedure, the man can still ejaculate fluid (semen). Another form of birth control must be used for 3 months after the procedure. Natural planning methods Natural family planning In this method, a couple does not have sex on days when the woman could become pregnant. Calendar method In this method, the woman keeps track of the length of each menstrual cycle, identifies the days when pregnancy can happen, and does not have sex on those days. Ovulation method In this method, a couple avoids sex during ovulation. Symptothermal method This method involves not having sex during ovulation. The woman typically checks for ovulation by watching changes in her temperature and in the consistency of cervical mucus.  Post-ovulation method In this method, a couple waits to have sex until after ovulation. Where to find more information Centers for Disease Control and Prevention: http://www.wolf.info/ Summary Contraception, also called birth control, refers to methods or devices that prevent pregnancy. Hormonal methods of contraception include implants, injections, pills, patches, vaginal rings, and emergency contraceptives. Barrier methods of contraception can include female condoms, female condoms, diaphragms, cervical caps, sponges, and spermicides. There are two types of IUDs (intrauterine devices). An IUD can be put in a woman's uterus to prevent pregnancy for 3-5 years. Permanent sterilization can be done through a procedure for males and females. Natural family planning methods involve nothaving sex on days when the woman could become pregnant. This information is not intended to replace advice given to you by your health care provider. Make sure you discuss any questions you have with your  health care provider. Document Revised: 08/27/2019 Document Reviewed: 08/27/2019 Elsevier Patient Education  [place].

## 2021-01-21 LAB — OBSTETRIC PANEL, INCLUDING HIV
Antibody Screen: NEGATIVE
Basophils Absolute: 0 10*3/uL (ref 0.0–0.2)
Basos: 1 %
EOS (ABSOLUTE): 0 10*3/uL (ref 0.0–0.4)
Eos: 1 %
HIV Screen 4th Generation wRfx: NONREACTIVE
Hematocrit: 34.8 % (ref 34.0–46.6)
Hemoglobin: 11.9 g/dL (ref 11.1–15.9)
Hepatitis B Surface Ag: NEGATIVE
Immature Grans (Abs): 0 10*3/uL (ref 0.0–0.1)
Immature Granulocytes: 0 %
Lymphocytes Absolute: 1.6 10*3/uL (ref 0.7–3.1)
Lymphs: 31 %
MCH: 28.3 pg (ref 26.6–33.0)
MCHC: 34.2 g/dL (ref 31.5–35.7)
MCV: 83 fL (ref 79–97)
Monocytes Absolute: 0.4 10*3/uL (ref 0.1–0.9)
Monocytes: 9 %
Neutrophils Absolute: 3 10*3/uL (ref 1.4–7.0)
Neutrophils: 58 %
Platelets: 265 10*3/uL (ref 150–450)
RBC: 4.2 x10E6/uL (ref 3.77–5.28)
RDW: 14.2 % (ref 11.7–15.4)
RPR Ser Ql: NONREACTIVE
Rh Factor: POSITIVE
Rubella Antibodies, IGG: 8.11 index (ref >0.99)
WBC: 5.2 10*3/uL (ref 3.4–10.8)

## 2021-01-21 LAB — AFP, SERUM, OPEN SPINA BIFIDA
AFP MoM: 1.31
AFP Value: 34.8 ng/mL
Gest. Age on Collection Date: 15.4 weeks
Maternal Age At EDD: 28.6 yr
OSBR Risk 1 IN: 9327
Test Results:: NEGATIVE
Weight: 246 [lb_av]

## 2021-01-21 LAB — CYTOLOGY - PAP
Comment: NEGATIVE
Diagnosis: NEGATIVE
High risk HPV: NEGATIVE

## 2021-01-21 LAB — HEMOGLOBIN A1C
Est. average glucose Bld gHb Est-mCnc: 111 mg/dL
Hgb A1c MFr Bld: 5.5 % (ref 4.8–5.6)

## 2021-01-21 LAB — CULTURE, OB URINE

## 2021-01-21 LAB — HEPATITIS C ANTIBODY: Hep C Virus Ab: <0.1 s/co ratio (ref 0.0–0.9)

## 2021-01-21 LAB — URINE CULTURE, OB REFLEX

## 2021-02-09 ENCOUNTER — Other Ambulatory Visit: Payer: Self-pay

## 2021-02-09 ENCOUNTER — Inpatient Hospital Stay (HOSPITAL_BASED_OUTPATIENT_CLINIC_OR_DEPARTMENT_OTHER): Payer: Medicaid Other

## 2021-02-09 ENCOUNTER — Encounter (HOSPITAL_COMMUNITY): Payer: Self-pay | Admitting: Obstetrics and Gynecology

## 2021-02-09 ENCOUNTER — Inpatient Hospital Stay (EMERGENCY_DEPARTMENT_HOSPITAL)
Admission: AD | Admit: 2021-02-09 | Discharge: 2021-02-09 | Disposition: A | Discharge status: Home / Self Care | Payer: Medicaid Other | Source: Home / Self Care | Attending: Obstetrics and Gynecology | Admitting: Obstetrics and Gynecology

## 2021-02-09 DIAGNOSIS — O1002 Pre-existing essential hypertension complicating childbirth: Secondary | ICD-10-CM | POA: Diagnosis not present

## 2021-02-09 DIAGNOSIS — O99891 Other specified diseases and conditions complicating pregnancy: Secondary | ICD-10-CM | POA: Diagnosis not present

## 2021-02-09 DIAGNOSIS — O99332 Smoking (tobacco) complicating pregnancy, second trimester: Secondary | ICD-10-CM | POA: Diagnosis not present

## 2021-02-09 DIAGNOSIS — Z20822 Contact with and (suspected) exposure to covid-19: Secondary | ICD-10-CM | POA: Diagnosis not present

## 2021-02-09 DIAGNOSIS — R1030 Lower abdominal pain, unspecified: Secondary | ICD-10-CM | POA: Insufficient documentation

## 2021-02-09 DIAGNOSIS — R102 Pelvic and perineal pain: Secondary | ICD-10-CM

## 2021-02-09 DIAGNOSIS — O26872 Cervical shortening, second trimester: Secondary | ICD-10-CM

## 2021-02-09 DIAGNOSIS — O26892 Other specified pregnancy related conditions, second trimester: Secondary | ICD-10-CM | POA: Insufficient documentation

## 2021-02-09 DIAGNOSIS — Z3A18 18 weeks gestation of pregnancy: Secondary | ICD-10-CM

## 2021-02-09 DIAGNOSIS — O42912 Preterm premature rupture of membranes, unspecified as to length of time between rupture and onset of labor, second trimester: Secondary | ICD-10-CM | POA: Diagnosis present

## 2021-02-09 DIAGNOSIS — Z87891 Personal history of nicotine dependence: Secondary | ICD-10-CM | POA: Diagnosis not present

## 2021-02-09 DIAGNOSIS — O021 Missed abortion: Secondary | ICD-10-CM | POA: Diagnosis not present

## 2021-02-09 DIAGNOSIS — O99214 Obesity complicating childbirth: Secondary | ICD-10-CM | POA: Diagnosis not present

## 2021-02-09 LAB — URINALYSIS, ROUTINE W REFLEX MICROSCOPIC
Bilirubin Urine: NEGATIVE
Glucose, UA: NEGATIVE mg/dL
Hgb urine dipstick: NEGATIVE
Ketones, ur: 20 mg/dL — AB
Leukocytes,Ua: NEGATIVE
Nitrite: NEGATIVE
Protein, ur: NEGATIVE mg/dL
Specific Gravity, Urine: 1.009 (ref 1.005–1.030)
pH: 6 (ref 5.0–8.0)

## 2021-02-09 LAB — WET PREP, GENITAL
Clue Cells Wet Prep HPF POC: NONE SEEN
Sperm: NONE SEEN
Trich, Wet Prep: NONE SEEN
Yeast Wet Prep HPF POC: NONE SEEN

## 2021-02-09 MED ORDER — PROGESTERONE 200 MG VA SUPP: 200.0000 mg | Freq: Every day | VAGINAL | 0 refills | Status: DC

## 2021-02-09 MED ORDER — PROGESTERONE 200 MG PO CAPS: 200.0000 mg | ORAL_CAPSULE | Freq: Every day | ORAL | 1 refills | Status: DC

## 2021-02-09 NOTE — MAU Provider Note (Signed)
Patient Ashley Jennings is a 30 y.o. [id number]  At [redacted]w[redacted]d here with complaints of pelvic pressure and abdominal pain on her lower abdomen. The pain is constant although sometimes it feels like a tightening. She also feels the baby "balling up". She denies VB, LOF, contractions, dysuria, nausea, vomiting, constipation. She denies fever, SOB, body aches, chills.  She has a history of pre-e; denies any history of DM.  Her last baby was April, 2013.   She was recently treated for [facility] and Trich on 01/12/2021.  History     CSN: [id number]  Arrival date and time: 02/09/21 1929   Event Date/Time   First Provider Initiated Contact with Patient 02/09/21 2000      Chief Complaint  Patient presents with   Abdominal Pain   Abdominal Pain This is a new problem. The current episode started today. The problem occurs constantly. The problem has been unchanged. The pain is located in the suprapubic region. The pain is at a severity of 8/10. The quality of the pain is cramping. The abdominal pain radiates to the back. Pertinent negatives include no constipation, diarrhea, dysuria, fever, nausea or vomiting. The pain is aggravated by movement (standing).  She started having cramping pains this morning when she woke up at 7 or 8 am. It got worse throughout the day. She states " I don't know if these are contractions because I don't remember what they feel like".  OB History     Gravida  4   Para  1   Term  1   Preterm  0   AB  2   Living  1      SAB  0   IAB  2   Ectopic  0   Multiple  0   Live Births  1           Past Medical History:  Diagnosis Date   Depression jan 2012   states just short-term when her mom passed away    Maternal varicella, non-immune 06/01/2011   Immunize postpartum    No pertinent past medical history    Pregnancy induced hypertension     Past Surgical History:  Procedure Laterality Date   DILATION AND CURETTAGE OF UTERUS     INDUCED ABORTION     NO  PAST SURGERIES      Family History  Problem Relation Age of Onset   Diabetes Mother    Kidney disease Mother    Hypertension Mother    Anesthesia problems Neg Hx     Social History   Tobacco Use   Smoking status: Former    Types: Cigarettes   Smokeless tobacco: Never  Vaping Use   Vaping Use: Never used  Substance Use Topics   Alcohol use: No   Drug use: No    Allergies: No Known Allergies  Medications Prior to Admission  Medication Sig Dispense Refill Last Dose   acetaminophen (TYLENOL) 500 MG tablet Take 500 mg by mouth every 6 (six) hours as needed.   02/09/2021   Prenatal Vit-Fe Fumarate-FA (PREPLUS) 27-1 MG TABS Take 1 tablet by mouth daily. 30 tablet 6 02/09/2021   aspirin EC 81 MG tablet Take 1 tablet (81 mg total) by mouth daily. Take after 12 weeks for prevention of preeclampsia later in pregnancy 300 tablet 2    metroNIDAZOLE (FLAGYL) 500 MG tablet Take 1 tablet (500 mg total) by mouth 2 (two) times daily. 14 tablet 0     Review of Systems  Constitutional: Negative.  Negative for fever.  HENT: Negative.    Respiratory:  Negative for chest tightness and shortness of breath.   Cardiovascular: Negative.   Gastrointestinal:  Positive for abdominal pain. Negative for constipation, diarrhea, nausea and vomiting.  Genitourinary:  Negative for dysuria, vaginal bleeding, vaginal discharge and vaginal pain.  Neurological: Negative.   Physical Exam   Blood pressure 131/63, pulse 83, temperature 98.3 F (36.8 C), temperature source Oral, resp. rate 16, height 5\' 9"  (1.753 m), weight 113.5 kg, last menstrual period 09/03/2020, SpO2 100 %.  Physical Exam Constitutional:      Appearance: She is well-developed.  HENT:     Head: Normocephalic.  Abdominal:     General: Abdomen is flat.     Palpations: Abdomen is soft.     Tenderness: There is no abdominal tenderness.  Genitourinary:    Vagina: Normal.  Skin:    General: Skin is warm.  Neurological:     Mental  Status: She is alert.    MAU Course  Procedures  MDM -wet prep is negative  -cervix was checked twice in MAU, cervix felt short but not dilating so was sent for transvaginal US.  -US shows shortened cervix with multiple fibroids, viable fetus -recheck for cervix 2 hours later shows no change, still 0.5 Assessment and Plan   1. Short cervical length during pregnancy in second trimester   2. Pelvic pain   -GC CT pending -discussed with Dr. Elgie Congo, who agrees with plan to discharge home with prometrium. Patient has follow up MFM appt on Thursday (three days) and she will have another cervical length then -discharge home with RX for prometrium suppositiroes (if covered) and progesterone tablets if not covered. Long discussion about options for using suppositories and tablets; when to take, how to take.  -strict return precautions given; patient still feels some abdominal pain but she is ambulating, laughing and talking in the room in no distress.  I reviewed that fibroids could also be cause for this discomfort, and round ligament pain could also be a factor.  I emphasized that she should return if pain worsens, any vaginal bleeding or discharge. I also discussed that fetus, should she deliver, would not be viable at 18 weeks. Patient was understanding of the situation and stated that she had faith that everything would be ok.   Mervyn Skeeters Nickalos Petersen 02/09/2021, 8:06 PM

## 2021-02-09 NOTE — MAU Note (Signed)
..  Ashley Jennings is a 30 y.o. at [redacted]w[redacted]d here in MAU reporting: lower sharp abdominal pain that occasionally feels like tightening and pelvic pressure. The pain is constant but the tightening is more when she lays down. +flutter. Denies vaginal bleeding or leaking of fluid.   Onset of complaint: This morning  Pain score: 7/10 Vitals:   02/09/21 1941  BP: 131/63  Pulse: 83  Resp: 16  Temp: 98.3 F (36.8 C)  SpO2: 100%     FHT:151 Lab orders placed from triage: UA

## 2021-02-09 NOTE — Discharge Instructions (Signed)
-  try to pick up prometrium (progesterone)  suppositories from the pharmacy. If these are not covered, pick up your prescription for prometrium (progesterone) pills and place those vaginally at night -keep appt with MFM on Thursday

## 2021-02-10 ENCOUNTER — Encounter (HOSPITAL_COMMUNITY): Payer: Self-pay | Admitting: Obstetrics and Gynecology

## 2021-02-10 ENCOUNTER — Inpatient Hospital Stay (HOSPITAL_BASED_OUTPATIENT_CLINIC_OR_DEPARTMENT_OTHER): Payer: Medicaid Other

## 2021-02-10 ENCOUNTER — Encounter: Payer: Self-pay | Admitting: Obstetrics and Gynecology

## 2021-02-10 ENCOUNTER — Observation Stay (HOSPITAL_COMMUNITY)
Admission: AD | Admit: 2021-02-10 | Discharge: 2021-02-10 | DRG: 806 | Disposition: A | Discharge status: Home / Self Care | Payer: Medicaid Other | Attending: Obstetrics and Gynecology | Admitting: Obstetrics and Gynecology

## 2021-02-10 DIAGNOSIS — Z87891 Personal history of nicotine dependence: Secondary | ICD-10-CM

## 2021-02-10 DIAGNOSIS — O26872 Cervical shortening, second trimester: Secondary | ICD-10-CM | POA: Diagnosis present

## 2021-02-10 DIAGNOSIS — E669 Obesity, unspecified: Secondary | ICD-10-CM

## 2021-02-10 DIAGNOSIS — Z20822 Contact with and (suspected) exposure to covid-19: Secondary | ICD-10-CM | POA: Diagnosis present

## 2021-02-10 DIAGNOSIS — O1002 Pre-existing essential hypertension complicating childbirth: Secondary | ICD-10-CM | POA: Diagnosis not present

## 2021-02-10 DIAGNOSIS — O021 Missed abortion: Secondary | ICD-10-CM | POA: Diagnosis not present

## 2021-02-10 DIAGNOSIS — Z3A18 18 weeks gestation of pregnancy: Secondary | ICD-10-CM

## 2021-02-10 DIAGNOSIS — O4692 Antepartum hemorrhage, unspecified, second trimester: Secondary | ICD-10-CM

## 2021-02-10 DIAGNOSIS — O99212 Obesity complicating pregnancy, second trimester: Secondary | ICD-10-CM

## 2021-02-10 DIAGNOSIS — D259 Leiomyoma of uterus, unspecified: Secondary | ICD-10-CM

## 2021-02-10 DIAGNOSIS — O09292 Supervision of pregnancy with other poor reproductive or obstetric history, second trimester: Secondary | ICD-10-CM

## 2021-02-10 DIAGNOSIS — O42912 Preterm premature rupture of membranes, unspecified as to length of time between rupture and onset of labor, second trimester: Secondary | ICD-10-CM | POA: Diagnosis present

## 2021-02-10 DIAGNOSIS — R102 Pelvic and perineal pain: Secondary | ICD-10-CM | POA: Diagnosis not present

## 2021-02-10 DIAGNOSIS — O3412 Maternal care for benign tumor of corpus uteri, second trimester: Secondary | ICD-10-CM | POA: Diagnosis not present

## 2021-02-10 DIAGNOSIS — O99214 Obesity complicating childbirth: Secondary | ICD-10-CM | POA: Diagnosis present

## 2021-02-10 DIAGNOSIS — O10919 Unspecified pre-existing hypertension complicating pregnancy, unspecified trimester: Secondary | ICD-10-CM | POA: Insufficient documentation

## 2021-02-10 DIAGNOSIS — O4290 Premature rupture of membranes, unspecified as to length of time between rupture and onset of labor, unspecified weeks of gestation: Secondary | ICD-10-CM | POA: Diagnosis present

## 2021-02-10 LAB — COMPREHENSIVE METABOLIC PANEL
ALT: 16 U/L (ref 0–44)
AST: 17 U/L (ref 15–41)
Albumin: 3 g/dL — ABNORMAL LOW (ref 3.5–5.0)
Alkaline Phosphatase: 46 U/L (ref 38–126)
Anion gap: 7 (ref 5–15)
BUN: <5 mg/dL — ABNORMAL LOW (ref 6–20)
CO2: 23 mmol/L (ref 22–32)
Calcium: 8.9 mg/dL (ref 8.9–10.3)
Chloride: 105 mmol/L (ref 98–111)
Creatinine, Ser: 0.64 mg/dL (ref 0.44–1.00)
GFR, Estimated: >60 mL/min (ref >60)
Glucose, Bld: 98 mg/dL (ref 70–99)
Potassium: 4.1 mmol/L (ref 3.5–5.1)
Sodium: 135 mmol/L (ref 135–145)
Total Bilirubin: 0.6 mg/dL (ref 0.3–1.2)
Total Protein: 6.3 g/dL — ABNORMAL LOW (ref 6.5–8.1)

## 2021-02-10 LAB — CBC
HCT: 32.5 % — ABNORMAL LOW (ref 36.0–46.0)
Hemoglobin: 11 g/dL — ABNORMAL LOW (ref 12.0–15.0)
MCH: 28.9 pg (ref 26.0–34.0)
MCHC: 33.8 g/dL (ref 30.0–36.0)
MCV: 85.5 fL (ref 80.0–100.0)
Platelets: 250 10*3/uL (ref 150–400)
RBC: 3.8 MIL/uL — ABNORMAL LOW (ref 3.87–5.11)
RDW: 14.3 % (ref 11.5–15.5)
WBC: 8.7 10*3/uL (ref 4.0–10.5)
nRBC: 0 % (ref 0.0–0.2)

## 2021-02-10 LAB — RESP PANEL BY RT-PCR (FLU A&B, COVID) ARPGX2
Influenza A by PCR: NEGATIVE
Influenza B by PCR: NEGATIVE
SARS Coronavirus 2 by RT PCR: NEGATIVE

## 2021-02-10 LAB — PREPARE RBC (CROSSMATCH)

## 2021-02-10 LAB — TSH: TSH: 4.344 u[IU]/mL (ref 0.350–4.500)

## 2021-02-10 LAB — ABO/RH: ABO/RH(D): A POS

## 2021-02-10 LAB — HIV ANTIBODY (ROUTINE TESTING W REFLEX): HIV Screen 4th Generation wRfx: NONREACTIVE

## 2021-02-10 MED ORDER — DIPHENHYDRAMINE HCL 25 MG PO CAPS: 25.0000 mg | ORAL_CAPSULE | Freq: Four times a day (QID) | ORAL | Status: DC | PRN

## 2021-02-10 MED ORDER — ONDANSETRON HCL 4 MG/2ML IJ SOLN: 4.0000 mg | Freq: Four times a day (QID) | INTRAMUSCULAR | Status: DC | PRN

## 2021-02-10 MED ORDER — BENZOCAINE-MENTHOL 20-0.5 % EX AERO: 1.0000 "application " | INHALATION_SPRAY | CUTANEOUS | Status: DC | PRN

## 2021-02-10 MED ORDER — LACTATED RINGERS IV BOLUS
1000.0000 mL | Freq: Once | INTRAVENOUS | Status: AC
  Administered 2021-02-10: 1000 mL via INTRAVENOUS

## 2021-02-10 MED ORDER — HYDROMORPHONE HCL 1 MG/ML IJ SOLN
1.0000 mg | INTRAMUSCULAR | Status: DC | PRN
  Administered 2021-02-10 (×2): 1 mg via INTRAVENOUS
  Filled 2021-02-10 (×2): qty 1

## 2021-02-10 MED ORDER — OXYTOCIN-SODIUM CHLORIDE 30-0.9 UT/500ML-% IV SOLN
2.5000 [IU]/h | INTRAVENOUS | Status: DC
  Filled 2021-02-10: qty 500

## 2021-02-10 MED ORDER — IBUPROFEN 600 MG PO TABS: 600.0000 mg | ORAL_TABLET | Freq: Four times a day (QID) | ORAL | 0 refills | Status: DC | PRN

## 2021-02-10 MED ORDER — TRANEXAMIC ACID-NACL 1000-0.7 MG/100ML-% IV SOLN: 1000.0000 mg | Freq: Once | INTRAVENOUS | Status: DC | PRN

## 2021-02-10 MED ORDER — ONDANSETRON HCL 4 MG/2ML IJ SOLN: 4.0000 mg | INTRAMUSCULAR | Status: DC | PRN

## 2021-02-10 MED ORDER — LACTATED RINGERS IV SOLN: 500.0000 mL | INTRAVENOUS | Status: DC | PRN

## 2021-02-10 MED ORDER — ACETAMINOPHEN 325 MG PO TABS: 650.0000 mg | ORAL_TABLET | ORAL | Status: DC | PRN

## 2021-02-10 MED ORDER — TRANEXAMIC ACID-NACL 1000-0.7 MG/100ML-% IV SOLN
1000.0000 mg | INTRAVENOUS | Status: AC
  Administered 2021-02-10: 1000 mg via INTRAVENOUS
  Filled 2021-02-10: qty 100

## 2021-02-10 MED ORDER — FENTANYL CITRATE (PF) 100 MCG/2ML IJ SOLN: 50.0000 ug | INTRAMUSCULAR | Status: DC | PRN

## 2021-02-10 MED ORDER — WITCH HAZEL-GLYCERIN EX PADS: 1.0000 "application " | MEDICATED_PAD | CUTANEOUS | Status: DC | PRN

## 2021-02-10 MED ORDER — OXYTOCIN-SODIUM CHLORIDE 30-0.9 UT/500ML-% IV SOLN: 2.5000 [IU]/h | INTRAVENOUS | Status: DC | PRN

## 2021-02-10 MED ORDER — OXYCODONE HCL 5 MG PO TABS
5.0000 mg | ORAL_TABLET | Freq: Four times a day (QID) | ORAL | Status: DC | PRN
  Administered 2021-02-10: 5 mg via ORAL
  Filled 2021-02-10: qty 1

## 2021-02-10 MED ORDER — MISOPROSTOL 200 MCG PO TABS: 400.0000 ug | ORAL_TABLET | ORAL | Status: DC

## 2021-02-10 MED ORDER — DIBUCAINE (PERIANAL) 1 % EX OINT: 1.0000 "application " | TOPICAL_OINTMENT | CUTANEOUS | Status: DC | PRN

## 2021-02-10 MED ORDER — ZOLPIDEM TARTRATE 5 MG PO TABS: 5.0000 mg | ORAL_TABLET | Freq: Every evening | ORAL | Status: DC | PRN

## 2021-02-10 MED ORDER — SIMETHICONE 80 MG PO CHEW: 80.0000 mg | CHEWABLE_TABLET | ORAL | Status: DC | PRN

## 2021-02-10 MED ORDER — CALCIUM CARBONATE ANTACID 500 MG PO CHEW: 2.0000 | CHEWABLE_TABLET | ORAL | Status: DC | PRN

## 2021-02-10 MED ORDER — IBUPROFEN 600 MG PO TABS
600.0000 mg | ORAL_TABLET | Freq: Four times a day (QID) | ORAL | Status: DC
  Administered 2021-02-10: 600 mg via ORAL
  Filled 2021-02-10: qty 1

## 2021-02-10 MED ORDER — DOCUSATE SODIUM 100 MG PO CAPS: 100.0000 mg | ORAL_CAPSULE | Freq: Every day | ORAL | Status: DC

## 2021-02-10 MED ORDER — OXYTOCIN BOLUS FROM INFUSION
333.0000 mL | Freq: Once | INTRAVENOUS | Status: AC
  Administered 2021-02-10: 250 mL via INTRAVENOUS

## 2021-02-10 MED ORDER — LIDOCAINE HCL (PF) 1 % IJ SOLN: 30.0000 mL | INTRAMUSCULAR | Status: DC | PRN

## 2021-02-10 MED ORDER — PRENATAL MULTIVITAMIN CH: 1.0000 | ORAL_TABLET | Freq: Every day | ORAL | Status: DC

## 2021-02-10 MED ORDER — COCONUT OIL OIL: 1.0000 "application " | TOPICAL_OIL | Status: DC | PRN

## 2021-02-10 MED ORDER — LACTATED RINGERS IV SOLN: INTRAVENOUS | Status: DC

## 2021-02-10 MED ORDER — ONDANSETRON HCL 4 MG PO TABS: 4.0000 mg | ORAL_TABLET | ORAL | Status: DC | PRN

## 2021-02-10 NOTE — Progress Notes (Signed)
1000- Patient assisted to the bathroom, voiding 300cc of bloody urine, 100cc clots, and 215cc QBL from her pad. 87- Dr. Ilda Basset updated on her blood loss, MD placing orders for TXA.

## 2021-02-10 NOTE — Discharge Summary (Signed)
Physician Discharge Summary  Patient ID: MYIESHA EDGAR MRN: [id number] DOB/AGE: 1990/04/29 30 y.o.  Admit date: 02/10/2021 Discharge date: 02/10/2021  Admission Diagnoses: Pregnancy at 18/5 weeks. IUFD. PROM. Vaginal bleeding.   Discharge Diagnoses: Status post vaginal delivery  Discharged Condition: good  Hospital Course: Patient presented on 11/7 to [facility] triage and diagnosed with a short cervix after presenting with abdominal pain. She presented approximately eight hours with an IUFD in addition to VB and PROM. A few hours after re-presenting she had a SVD and uncomplicated placental delivery. Patient doing well and desired d/c to home and was discharged to home later in the day   Discharge Exam: Blood pressure (!) 100/56, pulse 85, temperature 98 F (36.7 C), temperature source Oral, resp. rate 18, last menstrual period 09/03/2020, SpO2 100 %. NAD Abdomen: soft, nttp, nd Respiratory: no distress Skin: warm and dry  Disposition: Discharge disposition: 01-Home or Self Care       Discharge Instructions     Discharge patient   Complete by: As directed    Discharge disposition: 01-Home or Self Care   Discharge patient date: 02/10/2021      Allergies as of 02/10/2021   No Known Allergies      Medication List     STOP taking these medications    aspirin EC 81 MG tablet   progesterone 200 MG capsule Commonly known as: Prometrium   progesterone 200 MG Supp       TAKE these medications    acetaminophen 500 MG tablet Commonly known as: TYLENOL Take 500 mg by mouth every 6 (six) hours as needed.   ibuprofen 600 MG tablet Commonly known as: ADVIL Take 1 tablet (600 mg total) by mouth every 6 (six) hours as needed.   PrePLUS 27-1 MG Tabs Take 1 tablet by mouth daily.        Follow-up Information     [facility]. Go in 6 day(s).   Contact information: [address] [address] [phone]                 Signed: Aletha Halim 02/10/2021, 2:56 PM

## 2021-02-10 NOTE — H&P (Signed)
FACULTY PRACTICE ANTEPARTUM ADMISSION HISTORY AND PHYSICAL NOTE   History of Present Illness: Ashley Jennings is a 30 y.o. [id number] at [redacted]w[redacted]d admitted for PROM, significant vaginal bleeding and fetal demise.  Pt was seen earlier in the evening with a shortened cervix. Pt was to start vaginal progesterone and then follow up with MFM in the next few weeks for serial scans of the cervical length . Normal amniotic fluid was noted at that time in the MAU and viable fetus was seen.  Pt noted a large gush of clear fluid at approximately 0400.  Pt noted moderate vaginal bleeding shortly thereafter.  Initial bedside u/s upon return to MAU showed a slow heartbeat.  Formal scan afterwards showed IUFD.  The prenatal care had been complicated by a large lower uterine segment fibroid and gonorrhea earlier in pregnancy.  Currently she notes intermittent abdominal cramping every 3-4 minutes.   Patient Active Problem List   Diagnosis Date Noted   PROM (premature rupture of membranes) 2021/02/24   Fetal demise before 26 weeks with retention of dead fetus Feb 24, 2021   Vaginal bleeding in pregnancy, second trimester 2021/02/24   Hx of preeclampsia, prior pregnancy, currently pregnant 01/19/2021   Maternal obesity affecting pregnancy, antepartum 01/19/2021   Supervision of other normal pregnancy, antepartum 01/16/2021   Sexually transmitted disease complicating pregnancy, antepartum 01/15/2021    Past Medical History:  Diagnosis Date   Depression jan 2012   states just short-term when her mom passed away    Maternal varicella, non-immune 06/01/2011   Immunize postpartum    No pertinent past medical history    Pregnancy induced hypertension     Past Surgical History:  Procedure Laterality Date   DILATION AND CURETTAGE OF UTERUS     INDUCED ABORTION     NO PAST SURGERIES      OB History  Gravida Para Term Preterm AB Living  4 1 1  0 2 1  SAB IAB Ectopic Multiple Live Births  0 2 0 0 1    #  Outcome Date GA Lbr Len/2nd Weight Sex Delivery Anes PTL Lv  4 Current           3 Term 08/01/11 [redacted]w[redacted]d 29:32 / 02:41 3270 g F Vag-Vacuum EPI  LIV     Birth Comments: caput  2 IAB           1 IAB             Social History   Socioeconomic History   Marital status: Single    Spouse name: Not on file   Number of children: Not on file   Years of education: Not on file   Highest education level: Not on file  Occupational History   Not on file  Tobacco Use   Smoking status: Former    Types: Cigarettes   Smokeless tobacco: Never  Vaping Use   Vaping Use: Never used  Substance and Sexual Activity   Alcohol use: No   Drug use: No   Sexual activity: Not Currently    Birth control/protection: None, Injection  Other Topics Concern   Not on file  Social History Narrative   Not on file   Social Determinants of Health   Financial Resource Strain: Not on file  Food Insecurity: Not on file  Transportation Needs: Not on file  Physical Activity: Not on file  Stress: Not on file  Social Connections: Not on file    Family History  Problem Relation Age of Onset  Diabetes Mother    Kidney disease Mother    Hypertension Mother    Anesthesia problems Neg Hx     No Known Allergies  Medications Prior to Admission  Medication Sig Dispense Refill Last Dose   acetaminophen (TYLENOL) 500 MG tablet Take 500 mg by mouth every 6 (six) hours as needed.   02/09/2021   Prenatal Vit-Fe Fumarate-FA (PREPLUS) 27-1 MG TABS Take 1 tablet by mouth daily. 30 tablet 6 02/09/2021   aspirin EC 81 MG tablet Take 1 tablet (81 mg total) by mouth daily. Take after 12 weeks for prevention of preeclampsia later in pregnancy 300 tablet 2    progesterone (PROMETRIUM) 200 MG capsule Take 1 capsule (200 mg total) by mouth daily. 60 capsule 1    progesterone 200 MG SUPP Place 1 suppository (200 mg total) vaginally at bedtime. 30 suppository 0     Review of Systems -  see above  Vitals:  BP 125/64 (BP Location:  Right Arm)   Pulse 82   Temp 98.6 F (37 C) (Oral)   Resp 20   LMP 09/03/2020 (Exact Date)   SpO2 100%  Physical Examination: CONSTITUTIONAL: Well-developed, well-nourished female in no acute distress.  HENT:  Normocephalic, atraumatic, External right and left ear normal. Oropharynx is clear and moist EYES: Conjunctivae and EOM are normal.  NECK: Normal range of motion, supple, no masses SKIN: Skin is warm and dry. No rash noted. Not diaphoretic. No erythema. No pallor. [place]: Alert and oriented to person, place, and time. Normal reflexes, muscle tone coordination. No cranial nerve deficit noted. PSYCHIATRIC: Normal mood and affect. Normal behavior. Normal judgment and thought content. CARDIOVASCULAR: Normal heart rate noted, regular rhythm RESPIRATORY: Effort and breath sounds normal, no problems with respiration noted ABDOMEN: Soft, nontender, nondistended, gravid. MUSCULOSKELETAL: Normal range of motion. No edema and no tenderness. 2+ distal pulses.  Cervix:SSE:  cervix visually 1-1.5 cm dilated with a large clot in the cervical os, once nonclotted blood was removed from posterior vagina, there was no active bleeding.  A fragment of membranes could be seen sitting with the clot as well. Membranes:iruptured per ultrasound Fetal Monitoring:absent FHT Tocometer:   Labs:  Results for orders placed or performed during the hospital encounter of 02/09/21 (from the past 24 hour(s))  Urinalysis, Routine w reflex microscopic Urine, Clean Catch   Collection Time: 02/09/21  8:00 PM  Result Value Ref Range   Color, Urine YELLOW YELLOW   APPearance CLEAR CLEAR   Specific Gravity, Urine 1.009 1.005 - 1.030   pH 6.0 5.0 - 8.0   Glucose, UA NEGATIVE NEGATIVE mg/dL   Hgb urine dipstick NEGATIVE NEGATIVE   Bilirubin Urine NEGATIVE NEGATIVE   Ketones, ur 20 (A) NEGATIVE mg/dL   Protein, ur NEGATIVE NEGATIVE mg/dL   Nitrite NEGATIVE NEGATIVE   Leukocytes,Ua NEGATIVE NEGATIVE  Wet prep,  genital   Collection Time: 02/09/21  8:07 PM   Specimen: PATH Cytology Cervicovaginal Ancillary Only  Result Value Ref Range   Yeast Wet Prep HPF POC NONE SEEN NONE SEEN   Trich, Wet Prep NONE SEEN NONE SEEN   Clue Cells Wet Prep HPF POC NONE SEEN NONE SEEN   WBC, Wet Prep HPF POC FEW (A) NONE SEEN   Sperm NONE SEEN     Imaging Studies: [place] MFM OB COMP + 14 WK  Result Date: 01/15/2021 ----------------------------------------------------------------------  OBSTETRICS REPORT                       (  Signed Final 01/15/2021 02:09 pm) ---------------------------------------------------------------------- Patient Info  ID #:       [id number]                          D.O.B.:  Aug 12, 1990 (30 yrs)  Name:       Sylvie Farrier                       Visit Date: 01/15/2021 12:40 pm              Brothers ---------------------------------------------------------------------- Performed By  Attending:        Johnell Comings MD         Secondary Phy.:   [facility]                                                             [facility]  Performed By:     Jacob Moores BS,       Address:          [place], [place]                                                             [place], [place]  Referred By:      Woodroe Mode         Location:         [facility]                    MD                                       [facility]                                                             [place] [facility]                                                             [facility]  Ref. Address:     [address]  [place], [place] ---------------------------------------------------------------------- Orders  #  Description                           Code        Ordered By  1  [place] MFM OB COMP + 14 WK                76805.01    JAMES ARNOLD  ----------------------------------------------------------------------  #  Order #                     Accession #                Episode #  1  [id number]                   [id number]                 [id number] ---------------------------------------------------------------------- Indications  Obesity complicating pregnancy, second         O99.212  trimester  [redacted] weeks gestation of pregnancy                Z3A.15  Encounter for antenatal screening for          Z36.3  malformations  Late prenatal care, second trimester           O09.32  Tobacco use complicating pregnancy,            O99.332  second trimester  Poor obstetric history: Previous               O09.299  preeclampsia / eclampsia/gestational HTN ---------------------------------------------------------------------- Fetal Evaluation  Num Of Fetuses:         1  Fetal Heart Rate(bpm):  150  Cardiac Activity:       Observed  Presentation:           Variable  Placenta:               Posterior  P. Cord Insertion:      Visualized, central  Amniotic Fluid  AFI FV:      Within normal limits                              Largest Pocket(cm)                              3.2 ---------------------------------------------------------------------- Biometry  BPD:      29.9  mm     G. Age:  15w 3d         66  %    CI:        77.14   %    70 - 86                                                          FL/HC:      14.0   %    15.3 - 17.1  HC:  107.8  mm     G. Age:  15w 1d         39  %    HC/AC:      1.20        1.05 - 1.39  AC:         90  mm     G. Age:  15w 1d         62  %    FL/BPD:     50.5   %  FL:       15.1  mm     G. Age:  14w 3d         23  %    FL/AC:      16.8   %    20 - 24  HUM:      15.4  mm     G. Age:  14w 2d         32  %  CER:      14.6  mm     G. Age:  15w 4d         41  %  NFT:       2.6  mm  LV:        7.6  mm  CM:        2.8  mm  Est. FW:     109  gm      0 lb 4 oz     30  % ---------------------------------------------------------------------- OB  History  Gravidity:    3         Term:   1        Prem:   0        SAB:   0  TOP:          1       Ectopic:  0        Living: 1 ---------------------------------------------------------------------- Gestational Age  LMP:           19w 1d        Date:  09/03/20                 EDD:   06/10/21  U/S Today:     15w 0d                                        EDD:   07/09/21  Best:          15w 0d     Det. By:  U/S (01/15/21)           EDD:   07/09/21 ---------------------------------------------------------------------- Anatomy  Cranium:               Appears normal         LVOT:                   Not well visualized  Cavum:                 Not well visualized    Aortic Arch:            Not well visualized  Ventricles:            Appears normal         Ductal Arch:            Not well visualized  Choroid Plexus:  Appears normal         Diaphragm:              Visualized  Cerebellum:            Visualized             Stomach:                Visualized  Posterior Fossa:       Visualized             Abdomen:                Appears normal  Nuchal Fold:           Visualized             Abdominal Wall:         Not well visualized  Face:                  Visualized             Cord Vessels:           Appears normal (3                                                                        vessel cord)  Lips:                  Not well visualized    Kidneys:                Appear normal  Palate:                Not well visualized    Bladder:                Appears normal  Thoracic:              Appears normal         Spine:                  Not well visualized  Heart:                 Visualized             Upper Extremities:      Appears normal  RVOT:                  Not well visualized    Lower Extremities:      Visualized  Other:  Technically difficult due to maternal habitus and early GA. ---------------------------------------------------------------------- Cervix Uterus Adnexa  Cervix  Length:            3.8  cm.   Normal appearance by transabdominal scan.  Uterus  Multiple fibroids noted, see table below.  Right Ovary  Within normal limits.  Left Ovary  Within normal limits.  Cul De Sac  No free fluid seen.  Adnexa  No abnormality visualized. ---------------------------------------------------------------------- Myomas  Site                     L(cm)      W(cm)      D(cm)       Location  Fundus  4.3        5.4        5.6         Subserosal  Anterior                 1.2        1.9        1.3         Intramural ----------------------------------------------------------------------  Blood Flow                  RI       PI       Comments ---------------------------------------------------------------------- Comments  This patient was seen for an ultrasound exam today due to  maternal obesity.  She has not started prenatal care yet in  her current pregnancy.  She denies any significant past medical history.  As she has not started prenatal care, she has not had any  screening tests for fetal aneuploidy drawn in her current  pregnancy.  Based on the fetal biometry measurements obtained today,  her EDC was changed to July 09, 2021, making her 15 weeks  and 0 days pregnant today.  The patient is scheduled for her first prenatal visit next week.  She should have a cell free DNA test drawn at that time to  screen for fetal aneuploidy.  A detailed fetal anatomy scan has been scheduled in our  office at around 19 weeks.  We will confirm her dates with  that exam. ----------------------------------------------------------------------                   Johnell Comings, MD Electronically Signed Final Report   01/15/2021 02:09 pm ----------------------------------------------------------------------    Assessment and Plan: Patient Active Problem List   Diagnosis Date Noted   PROM (premature rupture of membranes) Feb 23, 2021   Fetal demise before 29 weeks with retention of dead fetus 02-23-2021   Vaginal bleeding in  pregnancy, second trimester 2021/02/23   Hx of preeclampsia, prior pregnancy, currently pregnant 01/19/2021   Maternal obesity affecting pregnancy, antepartum 01/19/2021   Supervision of other normal pregnancy, antepartum 01/16/2021   Sexually transmitted disease complicating pregnancy, antepartum 01/15/2021   Admit to Antenatal Due to PROM and now IUFD, can pursue expectant management for a few hours, but cytotec has been ordered to help expedite the process. Discussed with patient the risk of bleeding and retained placenta after delivery necessitating possible surgical removal. The patient is typed and crossed for 2 units of PRBC Pt will be handed off to Dr. Ilda Basset at change of shift.  Lynnda Shields, MD

## 2021-02-10 NOTE — MAU Note (Signed)
Blood bank called stating they need another blood type order on the patient. Phlebotomy notified and en route to the unit.

## 2021-02-10 NOTE — Progress Notes (Signed)
Chaplain spent time with pt RN Pam assisting with memorial care for baby. Chaplain introduced spiritual care and offered support after pt delivered in preterm son earlier this morning. Chaplain asked open ended questions to facilitate emotional expression and story telling. Ashley Jennings shared how quickly everything happened and that she is still coming to terms with her loss, one minute feeling at peace and the next overwhelmed. Chaplain normalized varied responses to grief and helped pt explore sources of support and coping strategies for the days ahead. Chaplain also provided resources for follow up grief support including the [facility] bereavement support group. Pt expressed gratitude for the visit.  Please page as further needs arise.  Donald Prose. Elyn Peers, M.Div. [facility] Chaplain Pager [phone] Office [phone]

## 2021-02-10 NOTE — MAU Note (Signed)
..  Ashley Jennings is a 30 y.o. at [redacted]w[redacted]d here in MAU reporting: Has returned via EMS stating around 4am she felt a pop and a gush of fluid and has had some bleeding ever since. Upon arrival and transfer to the stretcher patient passed a few large clots. Reports lower abdominal cramping.   Pain score: 8/10 Vitals:   02/10/21 0523  BP: 125/64  Pulse: 82  Resp: 20  Temp: 98.6 F (37 C)  SpO2: 100%

## 2021-02-10 NOTE — MAU Note (Signed)
Dr. Elgie Congo at bedside.

## 2021-02-10 NOTE — Progress Notes (Addendum)
0755- Received patient to room 117 from [facility]U, patient rating pain at 10/10. 0758 Dilaudid 1 mg IV given. 0810- Patient stating that "something is coming out", RN assess increased vaginal bleeding and medium sized clots. Room prepared for potential delivery. 2820- SVD of IUFD. Fetus wrapped and patient holding her baby. 71- Dr. Ilda Basset called and updated that patient has delivered fetus. 0830- Dr. Ilda Basset in room. 0838- Delivery of placenta by MD. [phone] Pitocin started per Dr. Ilda Basset at 250cc/hr. 0900- QBL 474ml. Fetal weight: 215 Grams

## 2021-02-11 LAB — GC/CHLAMYDIA PROBE AMP (~~LOC~~) NOT AT ARMC
Chlamydia: NEGATIVE
Comment: NEGATIVE
Comment: NORMAL
Neisseria Gonorrhea: NEGATIVE

## 2021-02-11 LAB — SYPHILIS: RPR W/REFLEX TO RPR TITER AND TREPONEMAL ANTIBODIES, TRADITIONAL SCREENING AND DIAGNOSIS ALGORITHM: RPR Ser Ql: NONREACTIVE

## 2021-02-12 ENCOUNTER — Ambulatory Visit: Payer: Medicaid Other

## 2021-02-12 LAB — BPAM RBC
Blood Product Expiration Date: 202211242359
Blood Product Expiration Date: 202211242359
Unit Type and Rh: 6200
Unit Type and Rh: 6200

## 2021-02-12 LAB — TYPE AND SCREEN
ABO/RH(D): A POS
Antibody Screen: NEGATIVE
Unit division: 0
Unit division: 0

## 2021-02-14 ENCOUNTER — Other Ambulatory Visit: Payer: Self-pay

## 2021-02-14 ENCOUNTER — Encounter (HOSPITAL_COMMUNITY): Payer: Self-pay | Admitting: Obstetrics and Gynecology

## 2021-02-14 ENCOUNTER — Inpatient Hospital Stay (HOSPITAL_COMMUNITY)
Admission: AD | Admit: 2021-02-14 | Discharge: 2021-02-14 | Disposition: A | Discharge status: Home / Self Care | Payer: Medicaid Other | Attending: Obstetrics and Gynecology | Admitting: Obstetrics and Gynecology

## 2021-02-14 ENCOUNTER — Inpatient Hospital Stay (HOSPITAL_COMMUNITY): Payer: Medicaid Other

## 2021-02-14 DIAGNOSIS — Z3A15 15 weeks gestation of pregnancy: Secondary | ICD-10-CM | POA: Diagnosis not present

## 2021-02-14 DIAGNOSIS — D251 Intramural leiomyoma of uterus: Secondary | ICD-10-CM | POA: Diagnosis not present

## 2021-02-14 DIAGNOSIS — I10 Essential (primary) hypertension: Secondary | ICD-10-CM

## 2021-02-14 DIAGNOSIS — N939 Abnormal uterine and vaginal bleeding, unspecified: Secondary | ICD-10-CM | POA: Diagnosis not present

## 2021-02-14 DIAGNOSIS — O3412 Maternal care for benign tumor of corpus uteri, second trimester: Secondary | ICD-10-CM | POA: Diagnosis not present

## 2021-02-14 DIAGNOSIS — O021 Missed abortion: Secondary | ICD-10-CM | POA: Diagnosis not present

## 2021-02-14 DIAGNOSIS — O26892 Other specified pregnancy related conditions, second trimester: Secondary | ICD-10-CM | POA: Diagnosis present

## 2021-02-14 LAB — URINALYSIS, ROUTINE W REFLEX MICROSCOPIC
Bacteria, UA: NONE SEEN
Bilirubin Urine: NEGATIVE
Glucose, UA: NEGATIVE mg/dL
Ketones, ur: NEGATIVE mg/dL
Nitrite: NEGATIVE
Protein, ur: 30 mg/dL — AB
RBC / HPF: >50 RBC/hpf — ABNORMAL HIGH (ref 0–5)
Specific Gravity, Urine: 1.019 (ref 1.005–1.030)
pH: 5 (ref 5.0–8.0)

## 2021-02-14 LAB — CBC WITH DIFFERENTIAL/PLATELET
Abs Immature Granulocytes: 0.05 10*3/uL (ref 0.00–0.07)
Basophils Absolute: 0 10*3/uL (ref 0.0–0.1)
Basophils Relative: 0 %
Eosinophils Absolute: 0 10*3/uL (ref 0.0–0.5)
Eosinophils Relative: 0 %
HCT: 23.7 % — ABNORMAL LOW (ref 36.0–46.0)
Hemoglobin: 7.9 g/dL — ABNORMAL LOW (ref 12.0–15.0)
Immature Granulocytes: 1 %
Lymphocytes Relative: 29 %
Lymphs Abs: 2.1 10*3/uL (ref 0.7–4.0)
MCH: 29.2 pg (ref 26.0–34.0)
MCHC: 33.3 g/dL (ref 30.0–36.0)
MCV: 87.5 fL (ref 80.0–100.0)
Monocytes Absolute: 0.6 10*3/uL (ref 0.1–1.0)
Monocytes Relative: 8 %
Neutro Abs: 4.5 10*3/uL (ref 1.7–7.7)
Neutrophils Relative %: 62 %
Platelets: 282 10*3/uL (ref 150–400)
RBC: 2.71 MIL/uL — ABNORMAL LOW (ref 3.87–5.11)
RDW: 14.6 % (ref 11.5–15.5)
WBC: 7.3 10*3/uL (ref 4.0–10.5)
nRBC: 0 % (ref 0.0–0.2)

## 2021-02-14 MED ORDER — FERROUS SULFATE 325 (65 FE) MG PO TABS: 325.0000 mg | ORAL_TABLET | ORAL | 3 refills | Status: AC

## 2021-02-14 MED ORDER — KETOROLAC TROMETHAMINE 60 MG/2ML IM SOLN
60.0000 mg | Freq: Once | INTRAMUSCULAR | Status: AC
  Administered 2021-02-14: 60 mg via INTRAMUSCULAR
  Filled 2021-02-14: qty 2

## 2021-02-14 MED ORDER — DIAZEPAM 2 MG PO TABS
2.0000 mg | ORAL_TABLET | Freq: Once | ORAL | Status: AC
  Administered 2021-02-14: 2 mg via ORAL
  Filled 2021-02-14: qty 1

## 2021-02-14 MED ORDER — IBUPROFEN 800 MG PO TABS: 800.0000 mg | ORAL_TABLET | Freq: Three times a day (TID) | ORAL | 0 refills | Status: AC

## 2021-02-14 MED ORDER — HYDROMORPHONE HCL 1 MG/ML IJ SOLN
1.0000 mg | Freq: Once | INTRAMUSCULAR | Status: AC
  Administered 2021-02-14: 1 mg via INTRAMUSCULAR
  Filled 2021-02-14: qty 1

## 2021-02-14 MED ORDER — LIDOCAINE HCL (PF) 1 % IJ SOLN
INTRAMUSCULAR | Status: AC
  Administered 2021-02-14: 30 mL
  Filled 2021-02-14: qty 30

## 2021-02-14 MED ORDER — HYDROCODONE-ACETAMINOPHEN 5-325 MG PO TABS
2.0000 | ORAL_TABLET | Freq: Once | ORAL | Status: AC
  Administered 2021-02-14: 2 via ORAL
  Filled 2021-02-14: qty 2

## 2021-02-14 MED ORDER — LIDOCAINE HCL (PF) 1 % IJ SOLN: 30.0000 mL | Freq: Once | INTRAMUSCULAR | Status: DC

## 2021-02-14 MED ORDER — SULFAMETHOXAZOLE-TRIMETHOPRIM 800-160 MG PO TABS: 1.0000 | ORAL_TABLET | Freq: Two times a day (BID) | ORAL | 0 refills | Status: AC

## 2021-02-14 MED ORDER — DOXYCYCLINE HYCLATE 100 MG PO TABS
200.0000 mg | ORAL_TABLET | ORAL | Status: AC
  Administered 2021-02-14: 200 mg via ORAL
  Filled 2021-02-14: qty 2

## 2021-02-14 MED ORDER — OXYCODONE HCL 5 MG PO TABS: 5.0000 mg | ORAL_TABLET | ORAL | 0 refills | Status: AC | PRN

## 2021-02-14 MED ORDER — NIFEDIPINE ER OSMOTIC RELEASE 30 MG PO TB24: 30.0000 mg | ORAL_TABLET | Freq: Every day | ORAL | 1 refills | Status: DC

## 2021-02-14 MED ORDER — SULFAMETHOXAZOLE-TRIMETHOPRIM 800-160 MG PO TABS
1.0000 | ORAL_TABLET | Freq: Once | ORAL | Status: AC
  Administered 2021-02-14: 1 via ORAL
  Filled 2021-02-14: qty 1

## 2021-02-14 NOTE — MAU Provider Note (Signed)
Lactation consult placed by Noni Saupe, NP, I offered to see patient for lactation needs.  [name] is a [id number] at PPD#4 after an IUFD and vaginal delivery. She was not educated prior to discharge about lactogenesis and how to dry up her milk. While here for possible endometritis, she also reported painful, full breasts. She is afebrile, but breasts are heavy, warm and she has bilateral redness areas of firmness on both sides consistent with inflammatory mastitis.   Apologized that she was not given education prior to discharge and expressed condolences for her loss. Discussed lactogenesis 2 that happens in all postpartum patients after 16wks (usually not until later but she has breastfed a child before), triggered by birth of the placenta. Helped her put her sports bra back on and tucked ice packs under each breast. Advised to keep swapping out ice packs while here and switch to ice and cabbage leaves when she gets home. Instructed FOB to have someone get a head of cabbage and leave it in their freezer for her to use. Advised her to use ice/cabbage for the next 24-48hrs, take 600mg  ibuprofen q6hrs and continue antibiotics IF discomfort does not progressively improve or she becomes febrile. Pt expressed gratitude and understanding. FOB very engaged and helpful.  Can follow up at [facility] with Nonah Mattes, RN, IBCLC if needed, also gave information in AVS about local support groups.  Gaylan Gerold, CNM, MSN, [place] Certified Nurse Midwife, [facility]

## 2021-02-14 NOTE — Op Note (Signed)
   MANUAL VACUUM ASPIRATION  Location of Procedure: MAU   Ashley Jennings is 30 y.o. [id number] presenting bleeding and abdominal pain following 18w IUFD. She was delivered about 4 days ago.   PROCEDURE DATE: 02/14/2021  PREOPERATIVE DIAGNOSIS: Retained products of conception POSTOPERATIVE DIAGNOSIS: The same PROCEDURE:   MANUAL VACUUM ASPIRATION under ULTRASOUND GUIDANCE SURGEON:  Radene Gunning   INDICATIONS: 30 y.o. [id number] with RPOC needing surgical completion.  Risks of surgery were discussed with the patient including but not limited to: bleeding which may require transfusion; infection which may require antibiotics; injury to uterus or surrounding organs; need for additional procedures including laparotomy or laparoscopy; possibility of intrauterine scarring which may impair future fertility; and other postoperative/anesthesia complications. Written informed consent was obtained.    FINDINGS:  A 15 week size uterus, moderate amounts of retained products of conception, specimen sent to pathology.  ANESTHESIA: Paracervical Block 12mL 0.5% lidocaine ESTIMATED BLOOD LOSS:  Less than 20 ml. SPECIMENS:  Products of conception sent to pathology COMPLICATIONS:  None immediate.  PROCEDURE DETAILS:  Prior to the start of the procedure the uterus was examined using ultrasound to confirm failed pregnancy. The patient received oral  Doxycycline 200mg  following the procedure. She was premedicated with Valium 2 mg, dilaudid 1 mg IV 15 minutes prior to procedure.   After an adequate timeout was performed, she was placed in the dorsal lithotomy position and examined.  A vaginal speculum was then placed in the patient's vagina and the vagina and cervix were cleaned using Betadine x3. Next a ring forcep was applied to the anterior lip of the cervix.  A paracervical block using 20 ml of 0.5% Lidocaine was administered. We then removed all instruments while awaiting paracervical block to become  effective. The cervix was already dilated enough to accommodate a 9/10 mm suction tube under ultrasound guidance. The suction curette was advanced gently advanced to the uterine fundus. The MVA apparatus was activated to create adequate suction and curette slowly rotated to clear the uterus of products of conception.  Ultrasound was used to assure removal of products of conception. After the fourth pass, there were no additional products that came out.  There was minimal bleeding noted and the ring forcep removed with good hemostasis noted.  All instruments were removed from the patient's vagina.  The patient tolerated the procedure well and was observed for at least 15 minutes prior to leaving.   We discussed iron after but also offered IV iron. Postprocedure instructions and expectations reviewed. Discussed postop medications as well.   Radene Gunning, MD 02/14/2021 1:41 PM   [facility]

## 2021-02-14 NOTE — MAU Provider Note (Signed)
History     CSN: [id number]  Arrival date and time: 02/14/21 5643   Event Date/Time   First Provider Initiated Contact with Patient 02/14/21 0914      Chief Complaint  Patient presents with   Abdominal Pain   Breast Pain   dark stool   Vaginal Bleeding   HPI   Ms.Ashley Jennings is a 30 y.o. female [id number] Status post vaginal delivery of 18w IUFD on 11/8 here with breast pain, abdominal pain and vaginal bleeding. She reports bleeding like a menstrual cycle. She reports ibuprofen is not working well for her pain. She is concerned about her bleeding and became more concerned when she passed a clot in the toilet. She reports bilateral breast pain and redness. She has not tried anything like ice or cabbage leaves, she was unsure what she could take for the breast pain. Hx of mastitis with previous delivery. Feels like this is mastitis again.  + chills, no fever.   OB History     Gravida  4   Para  2   Term  1   Preterm  1   AB  2   Living  1      SAB  0   IAB  2   Ectopic  0   Multiple  0   Live Births  1           Past Medical History:  Diagnosis Date   Depression jan 2012   states just short-term when her mom passed away    Maternal varicella, non-immune 06/01/2011   Immunize postpartum    No pertinent past medical history    Pregnancy induced hypertension     Past Surgical History:  Procedure Laterality Date   DILATION AND CURETTAGE OF UTERUS     INDUCED ABORTION     NO PAST SURGERIES      Family History  Problem Relation Age of Onset   Diabetes Mother    Kidney disease Mother    Hypertension Mother    Anesthesia problems Neg Hx     Social History   Tobacco Use   Smoking status: Former    Types: Cigarettes   Smokeless tobacco: Never  Vaping Use   Vaping Use: Never used  Substance Use Topics   Alcohol use: Yes    Alcohol/week: 1.0 standard drink    Types: 1 Glasses of wine per week    Comment: yesterday   Drug use: No     Allergies: No Known Allergies  Medications Prior to Admission  Medication Sig Dispense Refill Last Dose   ferrous sulfate 325 (65 FE) MG EC tablet Take 325 mg by mouth 3 (three) times daily with meals.   02/13/2021   ibuprofen (ADVIL) 600 MG tablet Take 1 tablet (600 mg total) by mouth every 6 (six) hours as needed. 30 tablet 0 02/13/2021   acetaminophen (TYLENOL) 500 MG tablet Take 500 mg by mouth every 6 (six) hours as needed.      Prenatal Vit-Fe Fumarate-FA (PREPLUS) 27-1 MG TABS Take 1 tablet by mouth daily. 30 tablet 6    Results for orders placed or performed during the hospital encounter of 02/14/21 (from the past 48 hour(s))  CBC with Differential/Platelet     Status: Abnormal   Collection Time: 02/14/21  8:40 AM  Result Value Ref Range   WBC 7.3 4.0 - 10.5 K/uL   RBC 2.71 (L) 3.87 - 5.11 MIL/uL   Hemoglobin 7.9 (L) 12.0 - 15.0  g/dL   HCT 23.7 (L) 36.0 - 46.0 %   MCV 87.5 80.0 - 100.0 fL   MCH 29.2 26.0 - 34.0 pg   MCHC 33.3 30.0 - 36.0 g/dL   RDW 14.6 11.5 - 15.5 %   Platelets 282 150 - 400 K/uL   nRBC 0.0 0.0 - 0.2 %   Neutrophils Relative % 62 %   Neutro Abs 4.5 1.7 - 7.7 K/uL   Lymphocytes Relative 29 %   Lymphs Abs 2.1 0.7 - 4.0 K/uL   Monocytes Relative 8 %   Monocytes Absolute 0.6 0.1 - 1.0 K/uL   Eosinophils Relative 0 %   Eosinophils Absolute 0.0 0.0 - 0.5 K/uL   Basophils Relative 0 %   Basophils Absolute 0.0 0.0 - 0.1 K/uL   Immature Granulocytes 1 %   Abs Immature Granulocytes 0.05 0.00 - 0.07 K/uL    Comment: Performed at [facility] [address] [place], [place] [address]  Urinalysis, Routine w reflex microscopic     Status: Abnormal   Collection Time: 02/14/21  9:12 AM  Result Value Ref Range   Color, Urine YELLOW YELLOW   APPearance HAZY (A) CLEAR   Specific Gravity, Urine 1.019 1.005 - 1.030   pH 5.0 5.0 - 8.0   Glucose, UA NEGATIVE NEGATIVE mg/dL   Hgb urine dipstick LARGE (A) NEGATIVE   Bilirubin Urine NEGATIVE NEGATIVE    Ketones, ur NEGATIVE NEGATIVE mg/dL   Protein, ur 30 (A) NEGATIVE mg/dL   Nitrite NEGATIVE NEGATIVE   Leukocytes,Ua SMALL (A) NEGATIVE   RBC / HPF >50 (H) 0 - 5 RBC/hpf   WBC, UA 21-50 0 - 5 WBC/hpf   Bacteria, UA NONE SEEN NONE SEEN   Squamous Epithelial / LPF 0-5 0 - 5   Mucus PRESENT     Comment: Performed at [facility], [place] [address] [place], [place] [address]    Review of Systems  Gastrointestinal:  Positive for abdominal pain.  Genitourinary:  Positive for vaginal bleeding.  Physical Exam   Blood pressure (!) 145/91, pulse 90, temperature 98.3 F (36.8 C), temperature source Oral, resp. rate 18, last menstrual period 09/03/2020, SpO2 99 %.  Patient Vitals for the past 24 hrs:  BP Temp Temp src Pulse Resp SpO2  02/14/21 1516 (!) 141/73 -- -- (!) 59 17 99 %  02/14/21 1501 137/66 -- -- (!) 57 -- --  02/14/21 1500 -- -- -- -- -- 100 %  02/14/21 1455 -- -- -- -- -- 100 %  02/14/21 1451 (!) 152/68 -- -- 70 -- --  02/14/21 1448 (!) 152/68 98.4 F (36.9 C) -- 83 17 100 %  02/14/21 1440 -- -- -- -- -- 100 %  02/14/21 1435 -- -- -- -- -- 99 %  02/14/21 1430 -- -- -- -- -- 100 %  02/14/21 1425 -- -- -- -- -- 99 %  02/14/21 1420 -- -- -- -- -- 100 %  02/14/21 1415 -- -- -- -- -- 99 %  02/14/21 1410 -- -- -- -- -- 99 %  02/14/21 1405 -- -- -- -- -- 100 %  02/14/21 1400 -- -- -- -- -- 100 %  02/14/21 1355 -- -- -- -- -- 99 %  02/14/21 1350 -- -- -- -- -- 100 %  02/14/21 1345 -- -- -- -- -- 100 %  02/14/21 1340 -- -- -- -- -- 100 %  02/14/21 1335 -- -- -- -- -- 100 %  02/14/21 1330 -- -- -- -- -- 100 %  02/14/21 1325 -- -- -- -- -- 100 %  02/14/21 1320 -- -- -- -- -- 100 %  02/14/21 1315 -- -- -- -- -- 99 %  02/14/21 1310 -- -- -- -- -- 100 %  02/14/21 1305 -- -- -- -- -- 99 %  02/14/21 1300 -- -- -- -- -- 100 %  02/14/21 1255 -- -- -- -- -- 100 %  02/14/21 1250 -- -- -- -- -- 99 %  02/14/21 1245 -- -- -- -- -- 100 %  02/14/21 1240 -- -- -- -- -- 100 %  02/14/21  1235 -- -- -- -- -- 100 %  02/14/21 1230 -- -- -- -- -- 100 %  02/14/21 1225 -- -- -- -- -- 100 %  02/14/21 1220 -- -- -- -- -- 100 %  02/14/21 1215 -- -- -- -- -- 100 %  02/14/21 1210 -- -- -- -- -- 100 %  02/14/21 1205 -- -- -- -- -- 100 %  02/14/21 1200 -- -- -- -- -- 100 %  02/14/21 1155 -- -- -- -- -- 100 %  02/14/21 1150 -- -- -- -- -- 100 %  02/14/21 1145 -- -- -- -- -- 100 %  02/14/21 1140 -- -- -- -- -- 100 %  02/14/21 1135 -- -- -- -- -- 100 %  02/14/21 1130 -- -- -- -- -- 99 %  02/14/21 1125 -- -- -- -- -- 100 %  02/14/21 1120 -- -- -- -- -- 100 %  02/14/21 1115 -- -- -- -- -- 100 %  02/14/21 1110 -- -- -- -- -- 100 %  02/14/21 1105 -- -- -- -- -- 92 %  02/14/21 1055 -- -- -- -- -- 100 %  02/14/21 1050 -- -- -- -- -- 100 %  02/14/21 1045 -- -- -- -- -- 99 %  02/14/21 1040 -- -- -- -- -- 99 %  02/14/21 1035 -- -- -- -- -- 99 %  02/14/21 1030 -- -- -- -- -- 99 %  02/14/21 1025 -- -- -- -- -- 99 %  02/14/21 1020 -- -- -- -- -- 99 %  02/14/21 1015 -- -- -- -- -- 100 %  02/14/21 1010 -- -- -- -- -- 97 %  02/14/21 1005 -- -- -- -- -- 98 %  02/14/21 1000 -- -- -- -- -- 98 %  02/14/21 0955 -- -- -- -- -- 100 %  02/14/21 0845 -- -- -- -- -- 100 %  02/14/21 0840 -- -- -- -- -- 100 %  02/14/21 0837 (!) 145/85 -- -- 81 -- --  02/14/21 0835 -- -- -- -- -- 99 %  02/14/21 0830 -- -- -- -- -- 99 %  02/14/21 0825 -- -- -- -- -- 100 %  02/14/21 0820 -- -- -- -- -- 99 %  02/14/21 0815 -- -- -- -- -- 100 %  02/14/21 0814 (!) 145/91 98.3 F (36.8 C) Oral 90 18 --  02/14/21 0811 (!) 145/91 -- -- 91 -- --  02/14/21 0810 -- -- -- -- -- 99 %     Physical Exam Vitals and nursing note reviewed.  Constitutional:      General: She is not in acute distress.    Appearance: She is well-developed. She is not ill-appearing, toxic-appearing or diaphoretic.  Chest:  Breasts:    Breasts are symmetrical.     Right: Mass, skin change  and tenderness present. No inverted nipple.     Left:  Mass, skin change and tenderness present. No inverted nipple.     Comments: Erythema noted throughout right breast.  Genitourinary:    Comments: Bimanual exam: Cervix anterior, no odor noted. Moderate amount of dark red blood noted on exam glove.  Exam by Noni Saupe, NP  Skin:    General: Skin is warm.  Neurological:     Mental Status: She is alert and oriented to person, place, and time.   [place] MFM OB Transvaginal  Result Date: 02/10/2021 ----------------------------------------------------------------------  OBSTETRICS REPORT                       (Signed Final 02/10/2021 04:26 pm) ---------------------------------------------------------------------- Patient Info  ID #:       [id number]                          D.O.B.:  04/02/1991 (30 yrs)  Name:       Sylvie Farrier                       Visit Date: 02/09/2021 08:32 pm              [place] ---------------------------------------------------------------------- Performed By  Attending:        Tama High MD        Secondary Phy.:   [facility]                                                             [facility]  Performed By:     Hubert Azure          Address:          [address]                    [place], [place]  Referred By:      Woodroe Mode         Location:         [facility]                                       [place]  Ref. Address:     [address]  [place], [place] ---------------------------------------------------------------------- Orders  #  Description                           Code        Ordered By  1  [place] MFM OB LIMITED                     76815.01    Maye Hides  2  [place] MFM OB TRANSVAGINAL                19379.0     Maye Hides  ----------------------------------------------------------------------  #  Order #                     Accession #                Episode #  1  [id number]                   [id number]                 [id number]  2  [id number]                   [id number]                 [id number] ---------------------------------------------------------------------- Indications  [redacted] weeks gestation of pregnancy                Z3A.18  Pelvic pain affecting pregnancy in second      O26.892  trimester  Obesity complicating pregnancy, second         O99.212  trimester  Late prenatal care, second trimester           O09.32  Tobacco use complicating pregnancy,            O99.332  second trimester  Poor obstetric history: Previous               O09.299  preeclampsia / eclampsia/gestational HTN ---------------------------------------------------------------------- Fetal Evaluation  Num Of Fetuses:         1  Fetal Heart Rate(bpm):  148  Cardiac Activity:       Observed  Presentation:           Cephalic  Placenta:               Posterior  P. Cord Insertion:      Visualized  Amniotic Fluid  AFI FV:      Within normal limits                              Largest Pocket(cm)                              5  Comment:    Stomach, diaphragm, and bladder noted ---------------------------------------------------------------------- OB History  Gravidity:    3         Term:   1        Prem:   0        SAB:   0  TOP:  1       Ectopic:  0        Living: 1 ---------------------------------------------------------------------- Gestational Age  LMP:           22w 5d        Date:  09/03/20                 EDD:   06/10/21  Best:          Renae Fickle 4d     Det. By:  U/S  (01/15/21)          EDD:   07/09/21 ---------------------------------------------------------------------- Cervix Uterus Adnexa  Cervix  Length:           1.73  cm.  Measured transvaginally.  Uterus  Multiple fibroids noted, see table below.  Right Ovary  Within normal limits.  Left Ovary  Within  normal limits.  Adnexa  No abnormality visualized. ---------------------------------------------------------------------- Myomas  Site                     L(cm)      W(cm)      D(cm)       Location  Posterior                7.4        6.6        7.4 ----------------------------------------------------------------------  Blood Flow                  RI       PI       Comments ---------------------------------------------------------------------- Impression  Patient was evaluated at the MAU for c/o lower abdominal  pain.  A limited ultrasound study was performed .Amniotic fluid is  normal and good fetal activity is seen . A large posterior  myoma is seen (measurements above).  On transvaginal ultrasound, the cervix measures 1.7 cm. ----------------------------------------------------------------------                  Tama High, MD Electronically Signed Final Report   02/10/2021 04:26 pm ----------------------------------------------------------------------  US PELVIC COMPLETE WITH TRANSVAGINAL  Result Date: 02/14/2021 CLINICAL DATA:  Postpartum with vaginal bleeding and pain. EXAM: TRANSABDOMINAL AND TRANSVAGINAL ULTRASOUND OF PELVIS TECHNIQUE: Both transabdominal and transvaginal ultrasound examinations of the pelvis were performed. Transabdominal technique was performed for global imaging of the pelvis including uterus, ovaries, adnexal regions, and pelvic cul-de-sac. It was necessary to proceed with endovaginal exam following the transabdominal exam to visualize the ovaries. COMPARISON:  February 10, 2021 FINDINGS: Uterus Measurements: 16 x 11 x 11 cm = volume: 10 30 mL. There is a 6.3 x 5 x 4.7 cm mass in the fundus of uterus consistent with uterine fibroid. Endometrium Thickness: 38 mm. The endometrium is heterogeneous with increased focal vascularity. Right ovary Measurements: 3.3 x 2.1 x 2.3 cm = volume: 8.7 mL. Normal appearance/no adnexal mass. Left ovary Measurements: 4.5 x 1.7 x 3.4 cm = volume:  17.47 mL. Normal appearance/no adnexal mass. Other findings No abnormal free fluid. IMPRESSION: Thickened heterogeneous endometrium with focal vascularity suspicious for retained products of conception. Uterine fibroid. Electronically Signed   By: Abelardo Diesel M.D.   On: 02/14/2021 09:45   [place] MFM OB COMP + 14 WK  Result Date: 01/15/2021 ----------------------------------------------------------------------  OBSTETRICS REPORT                       (Signed Final 01/15/2021 02:09 pm) ---------------------------------------------------------------------- Patient Info  ID #:  [id number]                          D.O.B.:  1991/03/24 (30 yrs)  Name:       DAVITA SUBLETT                       Visit Date: 01/15/2021 12:40 pm              [place] ---------------------------------------------------------------------- Performed By  Attending:        Johnell Comings MD         Secondary Phy.:   [facility]                                                             [facility]  Performed By:     Jacob Moores BS,       Address:          [place], [place]                                                             [place], [place]  Referred By:      Woodroe Mode         Location:         [facility]                    MD                                       [facility]                                                             [facility]                                                             [facility]  Ref. Address:     [address]  [place], [place] ---------------------------------------------------------------------- Orders  #  Description                           Code        Ordered By  1  [place] MFM OB COMP + 14 WK                76805.01    JAMES ARNOLD ----------------------------------------------------------------------  #  Order #                      Accession #                Episode #  1  [id number]                   [id number]                 [id number] ---------------------------------------------------------------------- Indications  Obesity complicating pregnancy, second         O99.212  trimester  [redacted] weeks gestation of pregnancy                Z3A.15  Encounter for antenatal screening for          Z36.3  malformations  Late prenatal care, second trimester           O09.32  Tobacco use complicating pregnancy,            O99.332  second trimester  Poor obstetric history: Previous               O09.299  preeclampsia / eclampsia/gestational HTN ---------------------------------------------------------------------- Fetal Evaluation  Num Of Fetuses:         1  Fetal Heart Rate(bpm):  150  Cardiac Activity:       Observed  Presentation:           Variable  Placenta:               Posterior  P. Cord Insertion:      Visualized, central  Amniotic Fluid  AFI FV:      Within normal limits                              Largest Pocket(cm)                              3.2 ---------------------------------------------------------------------- Biometry  BPD:      29.9  mm     G. Age:  15w 3d         66  %    CI:        77.14   %    70 - 86                                                          FL/HC:      14.0   %    15.3 - 17.1  HC:      107.8  mm     G. Age:  15w 1d  39  %    HC/AC:      1.20        1.05 - 1.39  AC:         90  mm     G. Age:  15w 1d         62  %    FL/BPD:     50.5   %  FL:       15.1  mm     G. Age:  14w 3d         23  %    FL/AC:      16.8   %    20 - 24  HUM:      15.4  mm     G. Age:  14w 2d         32  %  CER:      14.6  mm     G. Age:  15w 4d         41  %  NFT:       2.6  mm  LV:        7.6  mm  CM:        2.8  mm  Est. FW:     109  gm      0 lb 4 oz     30  % ---------------------------------------------------------------------- OB History  Gravidity:    3         Term:   1        Prem:   0        SAB:   0  TOP:          1        Ectopic:  0        Living: 1 ---------------------------------------------------------------------- Gestational Age  LMP:           19w 1d        Date:  09/03/20                 EDD:   06/10/21  U/S Today:     15w 0d                                        EDD:   07/09/21  Best:          15w 0d     Det. By:  U/S (01/15/21)           EDD:   07/09/21 ---------------------------------------------------------------------- Anatomy  Cranium:               Appears normal         LVOT:                   Not well visualized  Cavum:                 Not well visualized    Aortic Arch:            Not well visualized  Ventricles:            Appears normal         Ductal Arch:            Not well visualized  Choroid Plexus:        Appears normal         Diaphragm:  Visualized  Cerebellum:            Visualized             Stomach:                Visualized  Posterior Fossa:       Visualized             Abdomen:                Appears normal  Nuchal Fold:           Visualized             Abdominal Wall:         Not well visualized  Face:                  Visualized             Cord Vessels:           Appears normal (3                                                                        vessel cord)  Lips:                  Not well visualized    Kidneys:                Appear normal  Palate:                Not well visualized    Bladder:                Appears normal  Thoracic:              Appears normal         Spine:                  Not well visualized  Heart:                 Visualized             Upper Extremities:      Appears normal  RVOT:                  Not well visualized    Lower Extremities:      Visualized  Other:  Technically difficult due to maternal habitus and early GA. ---------------------------------------------------------------------- Cervix Uterus Adnexa  Cervix  Length:            3.8  cm.  Normal appearance by transabdominal scan.  Uterus  Multiple fibroids noted, see table below.  Right  Ovary  Within normal limits.  Left Ovary  Within normal limits.  Cul De Sac  No free fluid seen.  Adnexa  No abnormality visualized. ---------------------------------------------------------------------- Myomas  Site                     L(cm)      W(cm)      D(cm)       Location  Fundus                   4.3        5.4  5.6         Subserosal  Anterior                 1.2        1.9        1.3         Intramural ----------------------------------------------------------------------  Blood Flow                  RI       PI       Comments ---------------------------------------------------------------------- Comments  This patient was seen for an ultrasound exam today due to  maternal obesity.  She has not started prenatal care yet in  her current pregnancy.  She denies any significant past medical history.  As she has not started prenatal care, she has not had any  screening tests for fetal aneuploidy drawn in her current  pregnancy.  Based on the fetal biometry measurements obtained today,  her EDC was changed to July 09, 2021, making her 15 weeks  and 0 days pregnant today.  The patient is scheduled for her first prenatal visit next week.  She should have a cell free DNA test drawn at that time to  screen for fetal aneuploidy.  A detailed fetal anatomy scan has been scheduled in our  office at around 19 weeks.  We will confirm her dates with  that exam. ----------------------------------------------------------------------                   Johnell Comings, MD Electronically Signed Final Report   01/15/2021 02:09 pm ----------------------------------------------------------------------  [place] MFM OB LIMITED  Result Date: 02/10/2021 ----------------------------------------------------------------------  OBSTETRICS REPORT                       (Signed Final 02/10/2021 04:27 pm) ---------------------------------------------------------------------- Patient Info  ID #:       [id number]                           D.O.B.:  12-Sep-1990 (30 yrs)  Name:       Sylvie Farrier                       Visit Date: 02/10/2021 06:37 am              [place] ---------------------------------------------------------------------- Performed By  Attending:        Tama High MD        Secondary Phy.:   [facility]                                                             [facility]  Performed By:     Hubert Azure          Address:          [place]  [place], [place]  Referred By:      Woodroe Mode         Location[facility]                                       [place]  Ref. Address:     [place], [place] ---------------------------------------------------------------------- Orders  #  Description                           Code        Ordered By  1  [place] MFM OB LIMITED                     87681.15    Jorje Guild ----------------------------------------------------------------------  #  Order #                     Accession #                Episode #  1  [id number]                   [id number]                 [id number] ---------------------------------------------------------------------- Indications  Vaginal bleeding in pregnancy, second          O46.92  trimester  Cervical shortening, second trimester          O26.872  [redacted] weeks gestation of pregnancy                Q8G.50  Obesity complicating pregnancy, second         O99.212  trimester  Late prenatal care, second trimester           O09.32  Tobacco use complicating pregnancy,            O99.332  second trimester  Poor obstetric history: Previous               O09.299  preeclampsia / eclampsia/gestational HTN  Uterine fibroids affecting pregnancy in        O34.12, D25.9  second trimester, antepartum  ---------------------------------------------------------------------- Fetal Evaluation  Num Of Fetuses:         1  Cardiac Activity:       Absent  Presentation:           Transverse, head to maternal right  Amniotic Fluid  AFI FV:      Anhydramnios ---------------------------------------------------------------------- OB History  Gravidity:  3         Term:   1        Prem:   0        SAB:   0  TOP:          1       Ectopic:  0        Living: 1 ---------------------------------------------------------------------- Gestational Age  LMP:           22w 6d        Date:  09/03/20                 EDD:   06/10/21  Best:          Renae Fickle 5d     Det. By:  U/S  (01/15/21)          EDD:   07/09/21 ---------------------------------------------------------------------- Impression  Patient returned with significant vaginal bleeding.  A limited ultrasound study was performed .No measurable  pocket of amniotic fluid is seen. Unfortunately, fetal heart  activity was absent.  Impression: Fetal demise. ----------------------------------------------------------------------                  Tama High, MD Electronically Signed Final Report   02/10/2021 04:27 pm ----------------------------------------------------------------------  [place] MFM OB LIMITED  Result Date: 02/10/2021 ----------------------------------------------------------------------  OBSTETRICS REPORT                       (Signed Final 02/10/2021 04:26 pm) ---------------------------------------------------------------------- Patient Info  ID #:       [id number]                          D.O.B.:  09-17-1990 (30 yrs)  Name:       Sylvie Farrier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

## 2021-02-14 NOTE — Lactation Note (Signed)
Lactation Consultation Note  Patient Name: Ashley Jennings TMLYY'T Date: 02/14/2021   Age:29 y.o./  Per MAU RN who notified [facility] there was and order to see this patient  ( 18 week IUFD 11/8 ) due to  Possible mastitis/ hard, firm breast with lumps / no temperature.  LC responded she would be seeing the patient .  The CNM/ IBCLC - Gaylan Gerold -- called LC back and mentioned she had assessed her breast , painful, firm with lumps/ and she would see the patient regarding lactation needs .   Maternal Data    Feeding    LATCH Score                    Lactation Tools Discussed/Used    Interventions    Discharge    Consult Status      Ashley Jennings [facility] 02/14/2021, 10:07 AM

## 2021-02-14 NOTE — MAU Note (Addendum)
Patient arrived to MAU complaining of severe abdominal pain that is 10/10. Sore and lumpy breast that is 10/10, and black stool. Patient stated that she is currently taking iron supplements. Patient stated that the last time she took pain medications was yesterday morning 800mg  of ibuprofen. Patient stated that she urinated and noticed that the bleeding is becoming heavier, and she thinks a clot fell in the toilet. Patient had a fetal demise 02/10/21 at 18 wks

## 2021-02-16 ENCOUNTER — Ambulatory Visit: Payer: Medicaid Other | Admitting: Advanced Practice Midwife

## 2021-02-16 ENCOUNTER — Encounter: Payer: Medicaid Other | Admitting: Advanced Practice Midwife

## 2021-02-16 LAB — SURGICAL PATHOLOGY

## 2021-02-17 LAB — SURGICAL PATHOLOGY

## 2021-03-02 ENCOUNTER — Ambulatory Visit: Payer: Medicaid Other | Admitting: Obstetrics and Gynecology

## 2021-03-10 ENCOUNTER — Ambulatory Visit: Payer: Medicaid Other | Admitting: Obstetrics

## 2021-03-16 ENCOUNTER — Ambulatory Visit: Payer: Medicaid Other | Admitting: Obstetrics and Gynecology

## 2021-07-17 ENCOUNTER — Other Ambulatory Visit: Payer: Self-pay | Admitting: Student

## 2021-07-22 ENCOUNTER — Other Ambulatory Visit: Payer: Self-pay | Admitting: Lactation Services

## 2021-07-22 ENCOUNTER — Encounter: Payer: Self-pay | Admitting: Lactation Services

## 2021-07-22 MED ORDER — NIFEDIPINE ER OSMOTIC RELEASE 30 MG PO TB24: 30.0000 mg | ORAL_TABLET | Freq: Every day | ORAL | 1 refills | Status: AC

## 2021-07-22 NOTE — Progress Notes (Signed)
Reordered Nifedipine. Message sent to patient that she will need to follow up with Primary Care Provider for future refills. Sent PCP list to patient. ?

## 2022-02-18 ENCOUNTER — Emergency Department (HOSPITAL_COMMUNITY): Payer: Medicaid Other

## 2022-02-18 ENCOUNTER — Other Ambulatory Visit: Payer: Self-pay

## 2022-02-18 ENCOUNTER — Emergency Department (HOSPITAL_COMMUNITY)
Admission: EM | Admit: 2022-02-18 | Discharge: 2022-02-18 | Disposition: A | Discharge status: Home / Self Care | Payer: Medicaid Other | Attending: Emergency Medicine | Admitting: Emergency Medicine

## 2022-02-18 DIAGNOSIS — R059 Cough, unspecified: Secondary | ICD-10-CM | POA: Diagnosis present

## 2022-02-18 DIAGNOSIS — J101 Influenza due to other identified influenza virus with other respiratory manifestations: Secondary | ICD-10-CM | POA: Diagnosis not present

## 2022-02-18 DIAGNOSIS — Z20822 Contact with and (suspected) exposure to covid-19: Secondary | ICD-10-CM | POA: Insufficient documentation

## 2022-02-18 DIAGNOSIS — R000 Tachycardia, unspecified: Secondary | ICD-10-CM | POA: Diagnosis not present

## 2022-02-18 LAB — BASIC METABOLIC PANEL
Anion gap: 13 (ref 5–15)
BUN: <5 mg/dL — ABNORMAL LOW (ref 6–20)
CO2: 18 mmol/L — ABNORMAL LOW (ref 22–32)
Calcium: 8.7 mg/dL — ABNORMAL LOW (ref 8.9–10.3)
Chloride: 102 mmol/L (ref 98–111)
Creatinine, Ser: 0.87 mg/dL (ref 0.44–1.00)
GFR, Estimated: >60 mL/min (ref >60)
Glucose, Bld: 121 mg/dL — ABNORMAL HIGH (ref 70–99)
Potassium: 3.7 mmol/L (ref 3.5–5.1)
Sodium: 133 mmol/L — ABNORMAL LOW (ref 135–145)

## 2022-02-18 LAB — CBC WITH DIFFERENTIAL/PLATELET
Abs Immature Granulocytes: 0.02 10*3/uL (ref 0.00–0.07)
Basophils Absolute: 0 10*3/uL (ref 0.0–0.1)
Basophils Relative: 0 %
Eosinophils Absolute: 0.1 10*3/uL (ref 0.0–0.5)
Eosinophils Relative: 1 %
HCT: 38.9 % (ref 36.0–46.0)
Hemoglobin: 13.2 g/dL (ref 12.0–15.0)
Immature Granulocytes: 0 %
Lymphocytes Relative: 19 %
Lymphs Abs: 1.5 10*3/uL (ref 0.7–4.0)
MCH: 29.5 pg (ref 26.0–34.0)
MCHC: 33.9 g/dL (ref 30.0–36.0)
MCV: 87 fL (ref 80.0–100.0)
Monocytes Absolute: 0.6 10*3/uL (ref 0.1–1.0)
Monocytes Relative: 8 %
Neutro Abs: 5.5 10*3/uL (ref 1.7–7.7)
Neutrophils Relative %: 72 %
Platelets: 322 10*3/uL (ref 150–400)
RBC: 4.47 MIL/uL (ref 3.87–5.11)
RDW: 13.3 % (ref 11.5–15.5)
WBC: 7.7 10*3/uL (ref 4.0–10.5)
nRBC: 0 % (ref 0.0–0.2)

## 2022-02-18 LAB — RESP PANEL BY RT-PCR (FLU A&B, COVID) ARPGX2
Influenza A by PCR: POSITIVE — AB
Influenza B by PCR: NEGATIVE
SARS Coronavirus 2 by RT PCR: NEGATIVE

## 2022-02-18 LAB — PREGNANCY, URINE: Preg Test, Ur: NEGATIVE

## 2022-02-18 LAB — TROPONIN I (HIGH SENSITIVITY): Troponin I (High Sensitivity): 4 ng/L (ref <18)

## 2022-02-18 MED ORDER — ONDANSETRON HCL 4 MG PO TABS: 4.0000 mg | ORAL_TABLET | Freq: Four times a day (QID) | ORAL | 0 refills | Status: AC

## 2022-02-18 MED ORDER — LIDOCAINE VISCOUS HCL 2 % MT SOLN: 15.0000 mL | Freq: Two times a day (BID) | OROMUCOSAL | 0 refills | Status: AC

## 2022-02-18 MED ORDER — IBUPROFEN 400 MG PO TABS
600.0000 mg | ORAL_TABLET | Freq: Once | ORAL | Status: AC
  Administered 2022-02-18: 600 mg via ORAL
  Filled 2022-02-18: qty 1

## 2022-02-18 MED ORDER — ACETAMINOPHEN 325 MG PO TABS
650.0000 mg | ORAL_TABLET | Freq: Once | ORAL | Status: AC | PRN
  Administered 2022-02-18: 650 mg via ORAL
  Filled 2022-02-18: qty 2

## 2022-02-18 NOTE — ED Provider Notes (Signed)
[facility] EMERGENCY DEPARTMENT Provider Note   CSN: [id number] Arrival date & time: 02/18/22  0058     History  Chief Complaint  Patient presents with   Cough    Ashley Jennings is a 31 y.o. female.  HPI   Without significant medical history presents with complaints of URI-like symptoms started on Monday, endorses headaches fevers cough congestion, general body aches, nausea vomiting, states that she has a bit of a sore throat but denies any tongue throat lips or difficulty breathing, she also notes that she has some chest pains pain with coughing, she does have some pain without coughing, no history PEs or DVTs currently on hormone therapy, she is not immunocompromise.  Denies any recent sick contacts.    Home Medications Prior to Admission medications   Medication Sig Start Date End Date Taking? Authorizing Provider  lidocaine (XYLOCAINE) 2 % solution Use as directed 15 mLs in the mouth or throat 2 (two) times daily for 5 days. 02/18/22 02/23/22 Yes Marcello Fennel, PA-C  ondansetron (ZOFRAN) 4 MG tablet Take 1 tablet (4 mg total) by mouth every 6 (six) hours. 02/18/22  Yes Marcello Fennel, PA-C  ferrous sulfate (FERROUSUL) 325 (65 FE) MG tablet Take 1 tablet (325 mg total) by mouth every other day. 02/14/21   Radene Gunning, MD  ibuprofen (ADVIL) 800 MG tablet Take 1 tablet (800 mg total) by mouth 3 (three) times daily. 02/14/21   Radene Gunning, MD  NIFEdipine (PROCARDIA XL) 30 MG 24 hr tablet Take 1 tablet (30 mg total) by mouth daily. 07/22/21   Clarnce Flock, MD  oxyCODONE (OXY IR/ROXICODONE) 5 MG immediate release tablet Take 1 tablet (5 mg total) by mouth every 4 (four) hours as needed for severe pain or breakthrough pain. 02/14/21   Radene Gunning, MD  Prenatal Vit-Fe Fumarate-FA (PREPLUS) 27-1 MG TABS Take 1 tablet by mouth daily. 01/12/21   Woodroe Mode, MD  sulfamethoxazole-trimethoprim (BACTRIM DS) 800-160 MG tablet Take 1 tablet  by mouth 2 (two) times daily. 02/14/21   Rasch, Artist Pais, NP      Allergies    Patient has no known allergies.    Review of Systems   Review of Systems  Constitutional:  Positive for chills and fever.  HENT:  Positive for congestion.   Respiratory:  Positive for cough. Negative for shortness of breath.   Cardiovascular:  Positive for chest pain.  Gastrointestinal:  Negative for abdominal pain.  Musculoskeletal:  Positive for myalgias.  Neurological:  Negative for headaches.    Physical Exam Updated Vital Signs BP (!) 148/95 (BP Location: Right Arm)   Pulse (!) 101   Temp 99.1 F (37.3 C) (Oral)   Resp 20   Ht '5\' 9"'$  (1.753 m)   Wt 113.4 kg   SpO2 96%   BMI 36.92 kg/m  Physical Exam Vitals and nursing note reviewed.  Constitutional:      General: She is not in acute distress.    Appearance: She is not ill-appearing.  HENT:     Head: Normocephalic and atraumatic.     Nose: No congestion.  Eyes:     Conjunctiva/sclera: Conjunctivae normal.  Cardiovascular:     Rate and Rhythm: Regular rhythm. Tachycardia present.     Pulses: Normal pulses.     Heart sounds: No murmur heard.    No friction rub. No gallop.  Pulmonary:     Effort: No respiratory distress.     Breath sounds: No  wheezing, rhonchi or rales.  Skin:    General: Skin is warm and dry.  Neurological:     Mental Status: She is alert.  Psychiatric:        Mood and Affect: Mood normal.     ED Results / Procedures / Treatments   Labs (all labs ordered are listed, but only abnormal results are displayed) Labs Reviewed  RESP PANEL BY RT-PCR (FLU A&B, COVID) ARPGX2 - Abnormal; Notable for the following components:      Result Value   Influenza A by PCR POSITIVE (*)    All other components within normal limits  BASIC METABOLIC PANEL - Abnormal; Notable for the following components:   Sodium 133 (*)    CO2 18 (*)    Glucose, Bld 121 (*)    BUN <5 (*)    Calcium 8.7 (*)    All other components within  normal limits  CBC WITH DIFFERENTIAL/PLATELET  PREGNANCY, URINE  TROPONIN I (HIGH SENSITIVITY)  TROPONIN I (HIGH SENSITIVITY)    EKG None  Radiology DG Chest 2 View  Result Date: 02/18/2022 CLINICAL DATA:  Cough EXAM: CHEST - 2 VIEW COMPARISON:  None Available. FINDINGS: The heart size and mediastinal contours are within normal limits. Both lungs are clear. The visualized skeletal structures are unremarkable. IMPRESSION: No active cardiopulmonary disease. Electronically Signed   By: Ulyses Jarred M.D.   On: 02/18/2022 02:13    Procedures Procedures    Medications Ordered in ED Medications  acetaminophen (TYLENOL) tablet 650 mg (650 mg Oral Given 02/18/22 0148)  ibuprofen (ADVIL) tablet 600 mg (600 mg Oral Given 02/18/22 0148)    ED Course/ Medical Decision Making/ A&P                           Medical Decision Making Amount and/or Complexity of Data Reviewed Labs: ordered. Radiology: ordered.   This patient presents to the ED for concern of URI-like symptoms, this involves an extensive number of treatment options, and is a complaint that carries with it a high risk of complications and morbidity.  The differential diagnosis includes URI, pneumonia, PE    Additional history obtained:  Additional history obtained from partner at bedside External records from outside source obtained and reviewed including OB note   Co morbidities that complicate the patient evaluation  N/A  Social Determinants of Health:  No PCP    Lab Tests:  I Ordered, and personally interpreted labs.  The pertinent results include: CBC unremarkable, BMP shows sodium 133, CO2 of 18, glucose 101, first troponin 4, pregnancy negative, respiratory panel positive for influenza a   Imaging Studies ordered:  I ordered imaging studies including chest x-ray I independently visualized and interpreted imaging which showed negative acute findings I agree with the radiologist  interpretation   Cardiac Monitoring:  The patient was maintained on a cardiac monitor.  I personally viewed and interpreted the cardiac monitored which showed an underlying rhythm of: Without signs of ischemia   Medicines ordered and prescription drug management:  I ordered medication including N/A I have reviewed the patients home medicines and have made adjustments as needed  Critical Interventions:  N/A   Reevaluation:  Presents with URI-like symptoms, will obtain basic lab work-up provide her with ibuprofen Tylenol and reassess  Reassessed resting comfortably agreement with discharge at this time  Consultations Obtained:  N/A    Test Considered:  N/A    Rule out Low suspicion for systemic  infection as patient is nontoxic-appearing, vital signs reassuring, vital signs have improved, she is no longer febrile, tachycardia has also improved after providing her with antipyretics low suspicion for pneumonia as lung sounds are clear bilaterally, x-ray did not reveal any acute findings.  I have low suspicion for PE as patient denies pleuritic chest pain, shortness of breath, presentation is more consistent with a viral URI as she test positive for influenza, she was noted be tachycardic but again this is likely secondary due to viral infection.  She is not tachypneic or hypoxic.Marland Kitchen low suspicion for strep throat as oropharynx was visualized, no erythema or exudates noted.  Low suspicion patient would need  hospitalized due to viral infection or Covid as vital signs reassuring, patient is not in respiratory distress.      Dispostion and problem list  After consideration of the diagnostic results and the patients response to treatment, I feel that the patent would benefit from discharge.  Influenza-patient is outside the treatment window for Tamiflu, she is also not immunocompromise, risks outweigh benefits for this patient, will recommend conservative management follow-up with  PCP as needed strict return precautions.            Final Clinical Impression(s) / ED Diagnoses Final diagnoses:  Influenza A    Rx / DC Orders ED Discharge Orders          Ordered    ondansetron (ZOFRAN) 4 MG tablet  Every 6 hours        02/18/22 0443    lidocaine (XYLOCAINE) 2 % solution  2 times daily        02/18/22 [place], Kyrielle Urbanski J, PA-C 02/18/22 [place], [place], MD 02/18/22 [phone]

## 2022-02-18 NOTE — Discharge Instructions (Signed)
You have the flu, recommend over-the-counter pain medications like ibuprofen Tylenol for fever and pain control, nasal decongestions like Flonase and Zyrtec, Mucinex for cough.  If not eating recommend supplementing with Gatorade to help with electrolyte supplementation.  Given you Zofran for nausea, given you viscous lidocaine for your sore throat  Follow-up PCP for further evaluation.  Come back to the emergency department if you develop chest pain, shortness of breath, severe abdominal pain, uncontrolled nausea, vomiting, diarrhea.

## 2022-02-18 NOTE — ED Triage Notes (Signed)
Pt c/o sore throat, chest aching, and a productive cough starting Monday afternoon.

## 2022-02-18 NOTE — ED Provider Triage Note (Signed)
Emergency Medicine Provider Triage Evaluation Note  Ashley Jennings , a 31 y.o. female  was evaluated in triage.  Pt complains of URI-like symptoms, started on Tuesday started initially as a sore throat, but then developed a cough, productive, now she is having chest pain, feels short of breath, denies pleuritic chest pain, chest pain is present even without coughing, she has not gotten her COVID or her influenza vaccine she has no other complaints..  Review of Systems  Positive: Chest pain, cough Negative: DVTs, UTI  Physical Exam  BP (!) 161/120 (BP Location: Right Arm)   Pulse (!) 113   Temp (!) 103 F (39.4 C) (Oral)   Resp 18   Ht '5\' 9"'$  (1.753 m)   Wt 113.4 kg   SpO2 100%   BMI 36.92 kg/m  Gen:   Awake, no distress   Resp:  Normal effort  MSK:   Moves extremities without difficulty  Other:    Medical Decision Making  Medically screening exam initiated at 1:45 AM.  Appropriate orders placed.  Ashley Jennings was informed that the remainder of the evaluation will be completed by another provider, this initial triage assessment does not replace that evaluation, and the importance of remaining in the ED until their evaluation is complete.  Lab work imaging ordered will need further work-up.   Marcello Fennel, PA-C 02/18/22 0147

## 2023-06-14 IMAGING — US US MFM OB LIMITED
1 series · 15 of 20 positions shown · non-contrast
Comparison: none

[REDACTED]
                   [id number]

Indications
 Vaginal bleeding in pregnancy, second
 trimester
 Cervical shortening, second trimester
 18 weeks gestation of pregnancy
 Obesity complicating pregnancy, second
 Late prenatal care, second trimester
 Tobacco use complicating pregnancy,
 second trimester
 Poor obstetric history: Previous
 preeclampsia / eclampsia/gestational HTN
 Uterine fibroids affecting pregnancy in        O34.12,
 second trimester, antepartum
Fetal Evaluation
 Num Of Fetuses:         1
 Cardiac Activity:       Absent
 Presentation:           Transverse, head to maternal right
 Amniotic Fluid
 AFI FV:      Anhydramnios
OB History
 Gravidity:    3         Term:   1        Prem:   0        SAB:   0
 TOP:          1       Ectopic:  0        Living: 1
Gestational Age
 LMP:           22w 6d        Date:  09/03/20                 EDD:   06/10/21
 Best:          18w 5d     Det. By:  U/S  (01/15/21)          EDD:   07/09/21
Impression
 Patient returned with significant vaginal bleeding.
 A limited ultrasound study was performed .No measurable
 pocket of amniotic fluid is seen. Unfortunately, fetal heart
 activity was absent.

[Series 1: us mfm ob limited · 20 acquisitions, 15 frames shown]
[im 1/20]
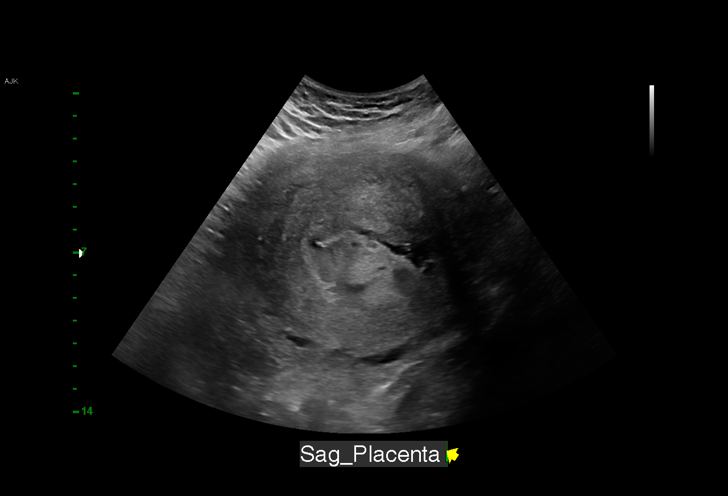
[im 3/20]
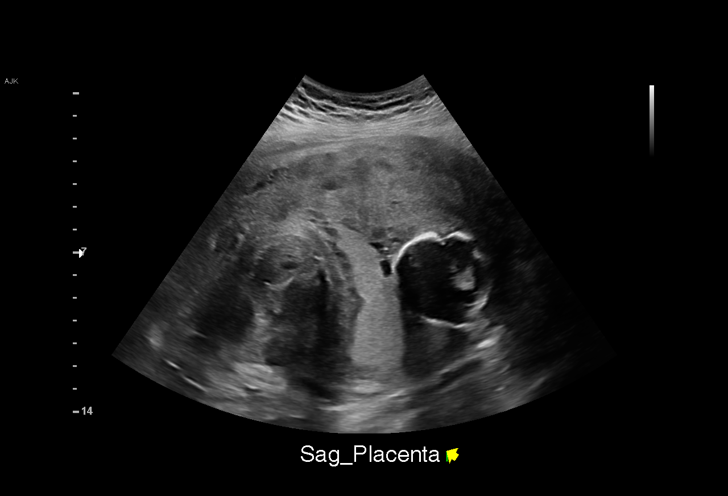
[im 4/20]
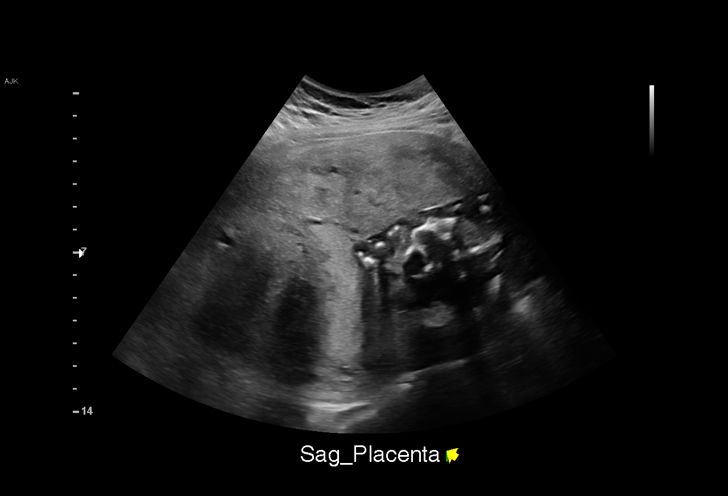
[im 5/20]
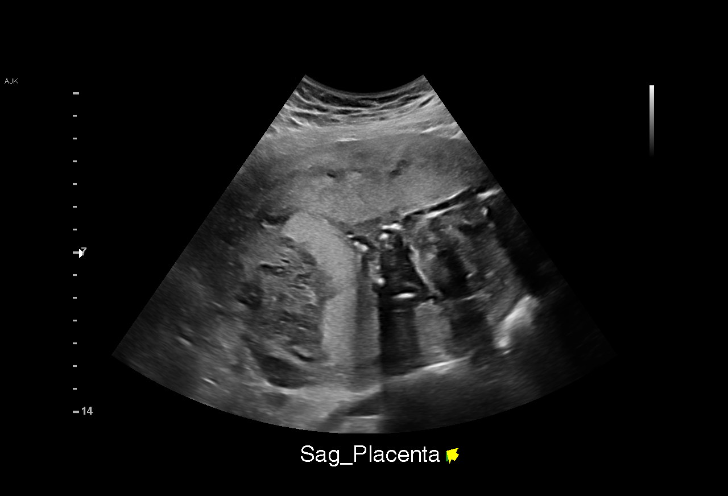
[im 7/20]
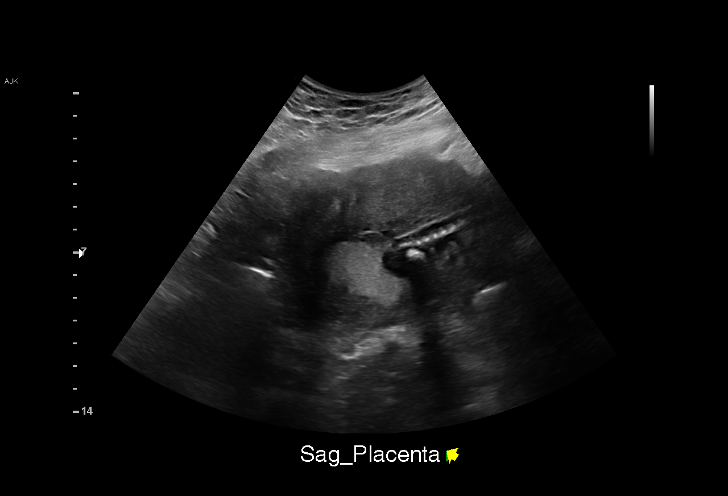
[im 8/20]
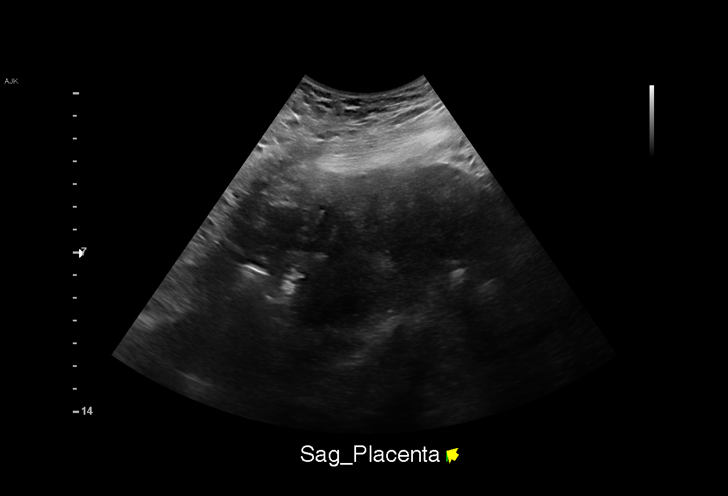
[im 9/20]
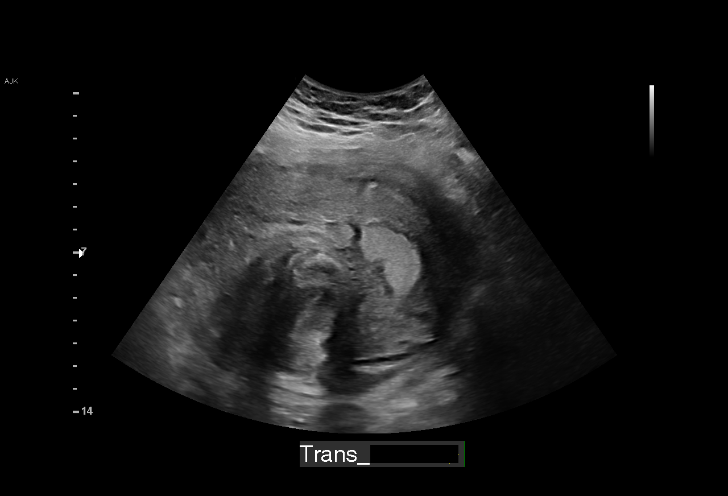
[im 11/20]
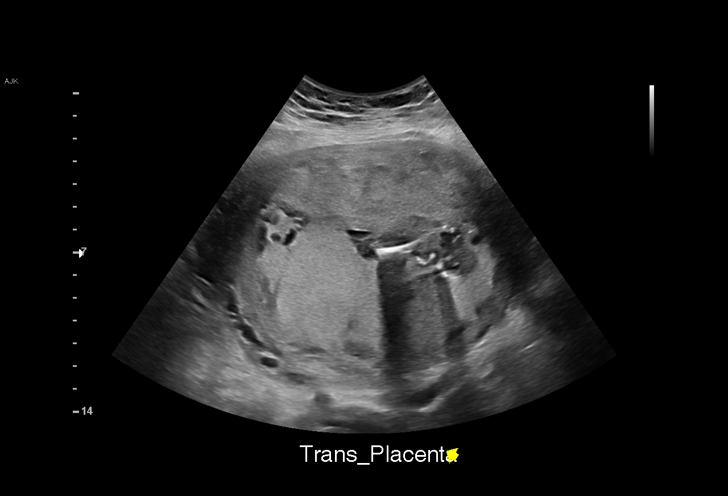
[im 12/20]
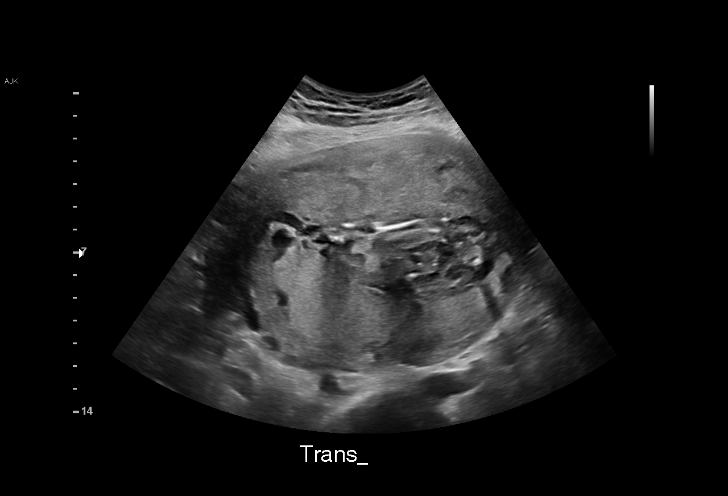
[im 13/20]
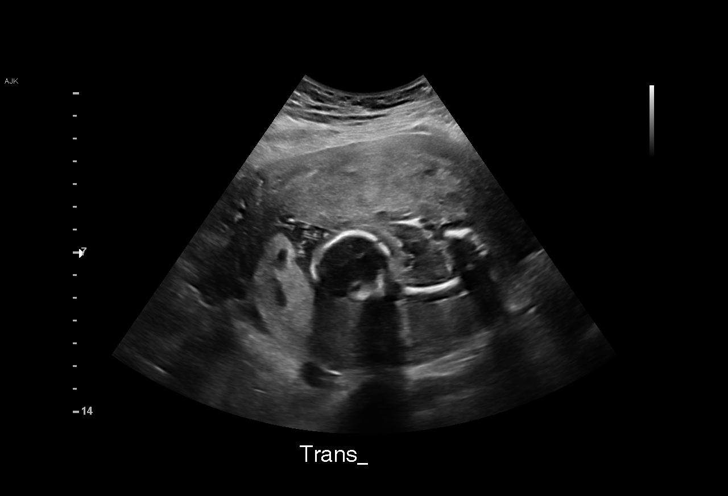
[im 15/20]
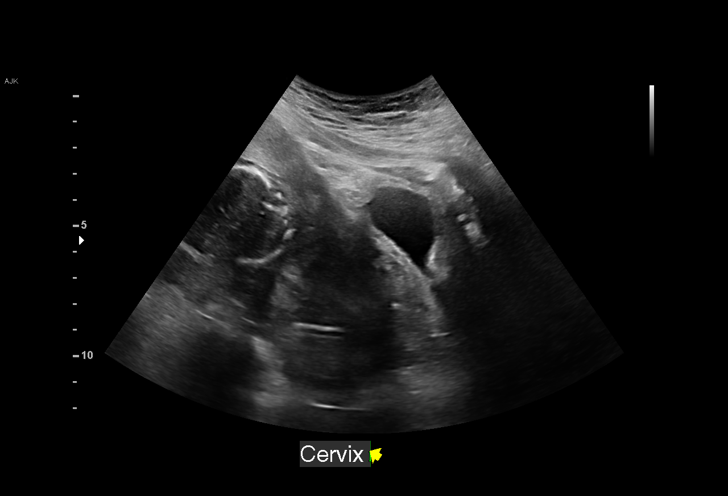
[im 16/20]
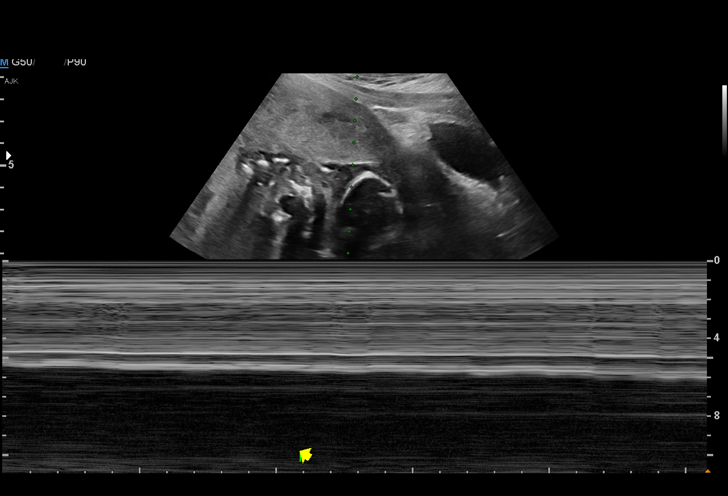
[im 17/20]
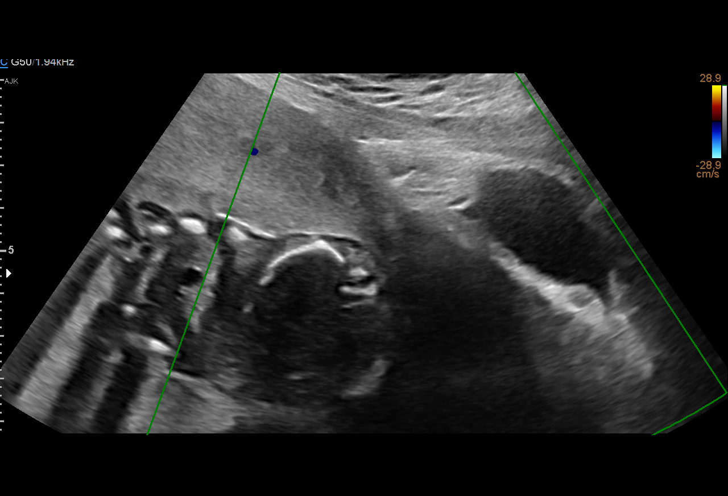
[im 19/20]
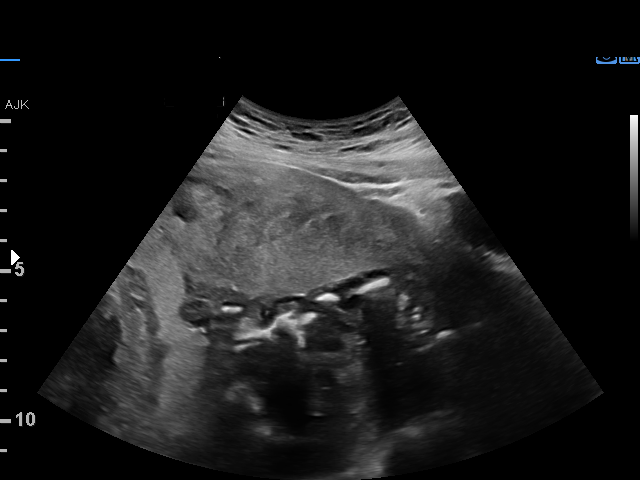
[im 20/20]
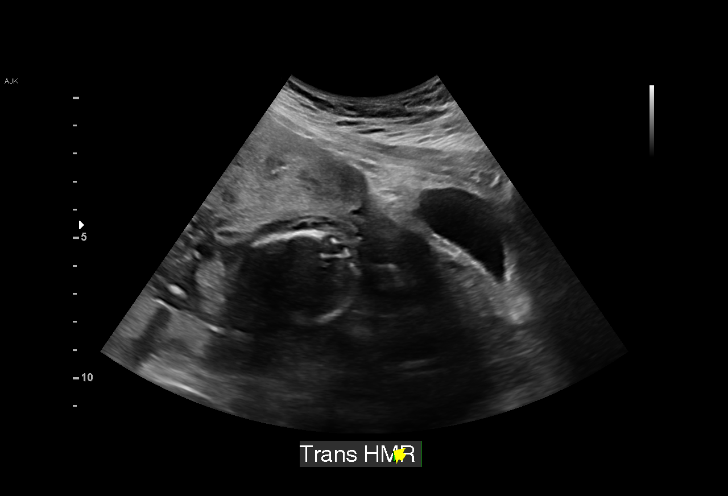

[15 of 20 positions shown; findings below may reference images not displayed]

IMPRESSION: Fetal demise.
                 HAZUKA

## 2023-06-18 IMAGING — US US PELVIS COMPLETE WITH TRANSVAGINAL
2 series · 15 of 25 positions shown · non-contrast
Comparison: February 10, 2021

CLINICAL DATA: Postpartum with vaginal bleeding and pain.



[Series 1: us pelvis complete with transvaginal · 39 acquisitions, 14 frames shown (1 of 2)]
[im 1/39]
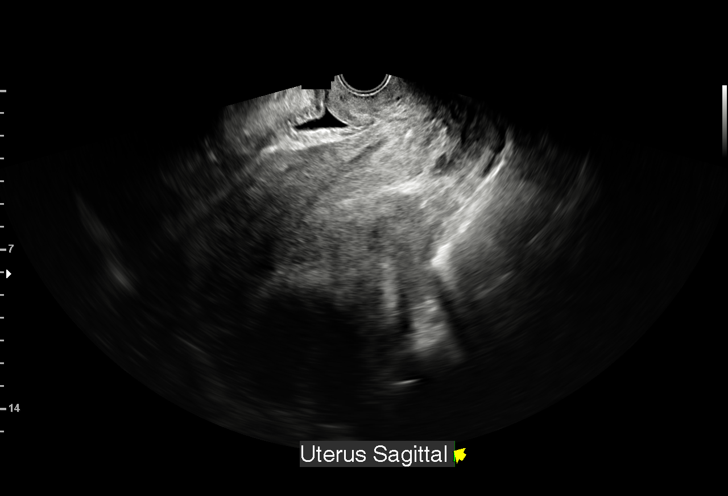
[im 4/39]
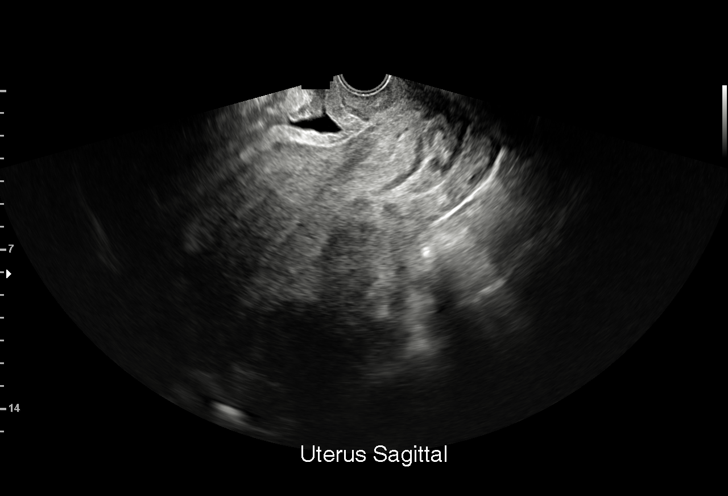
[im 7/39]
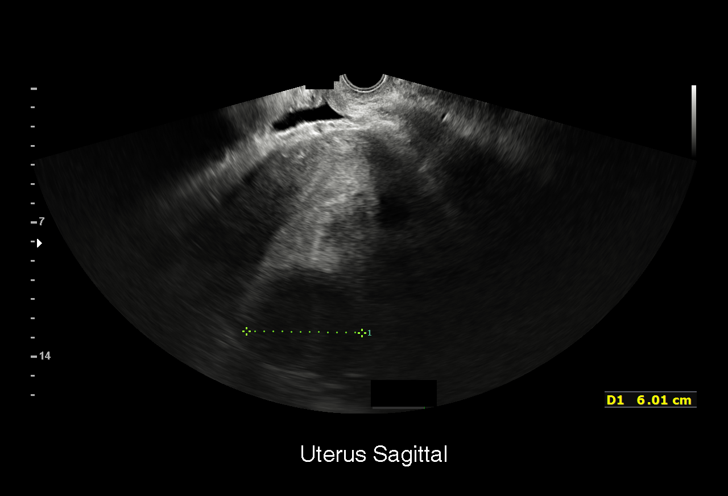
[im 9/39]
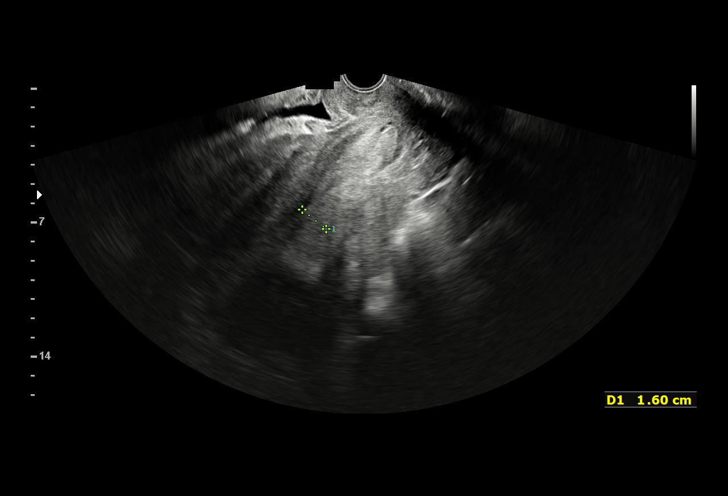
[im 12/39]
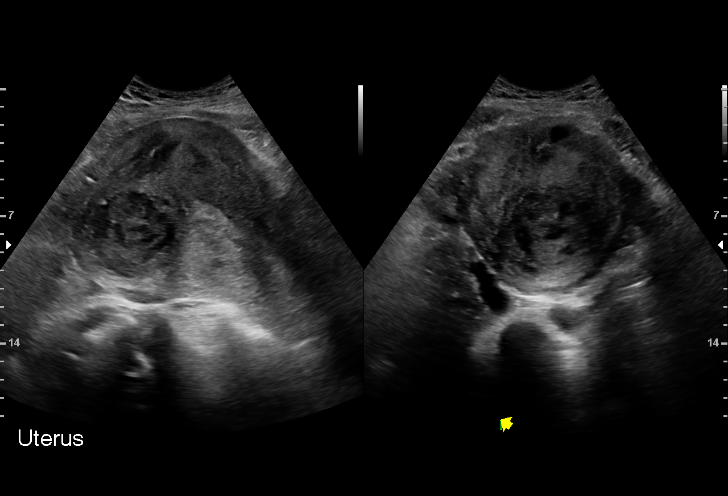
[im 15/39]
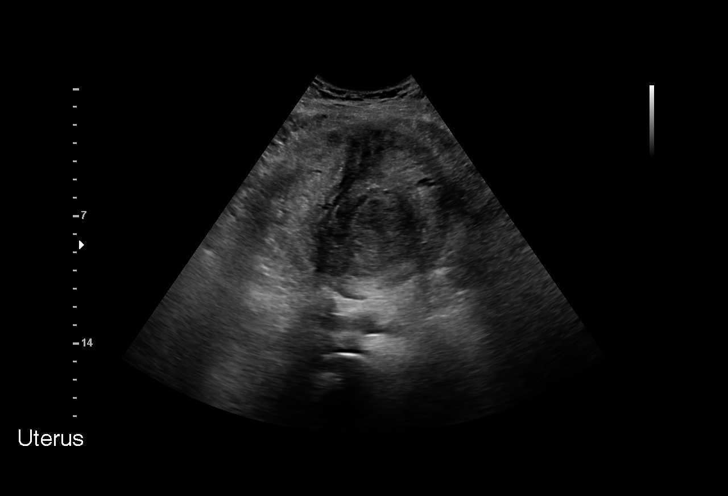
[im 17/39]
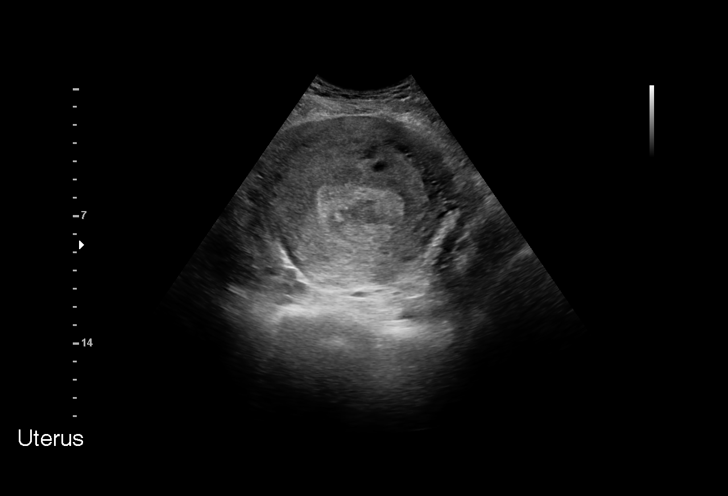
[im 20/39]
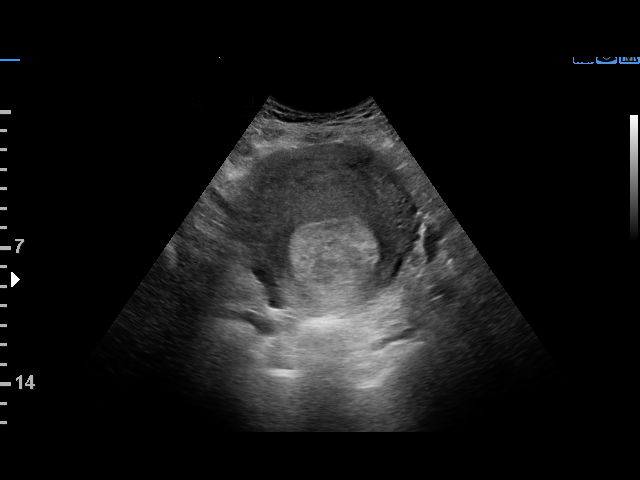
[im 24/39]
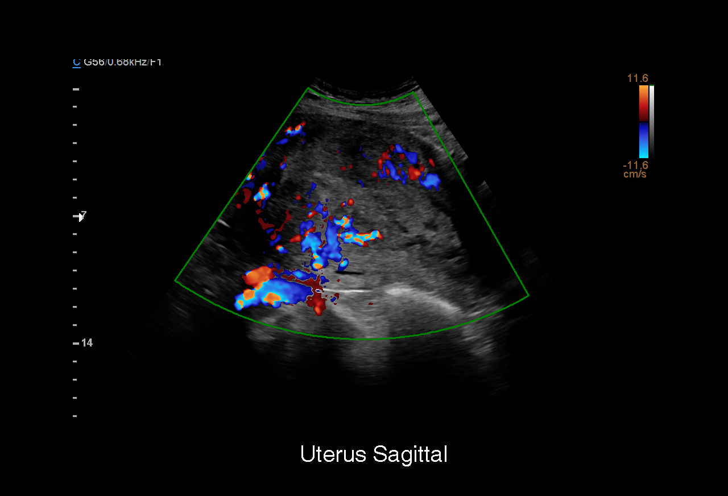
[im 25/39]
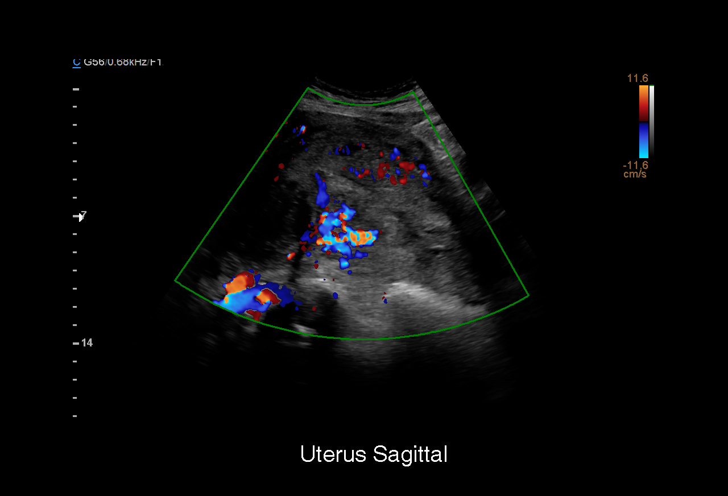
[im 29/39]
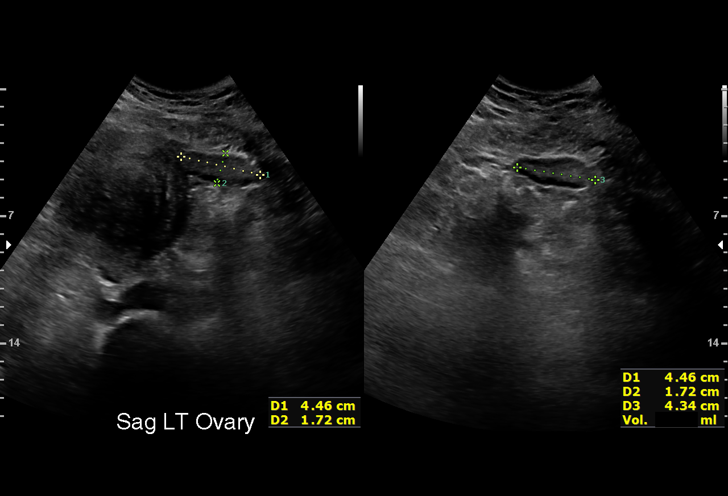
[im 32/39]
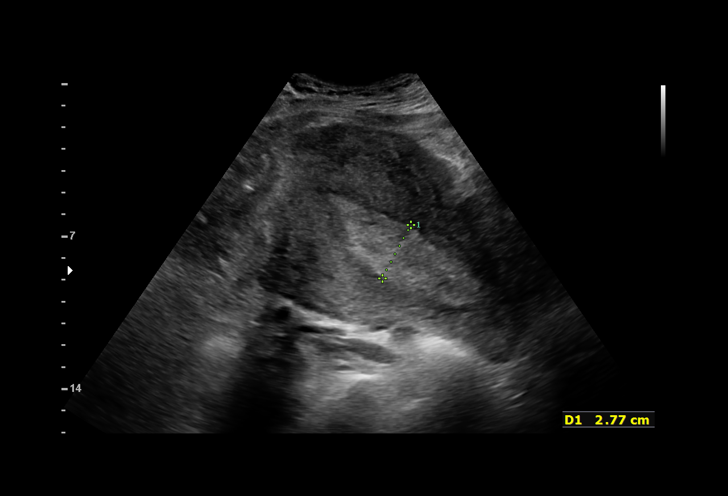
[im 34/39]
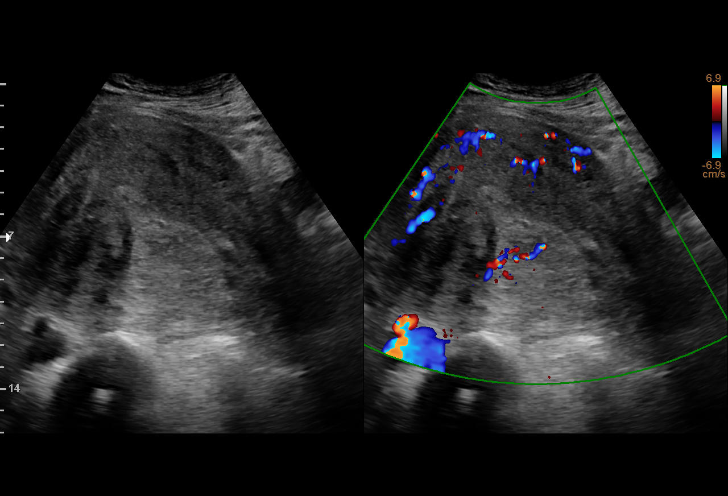
[im 37/39]
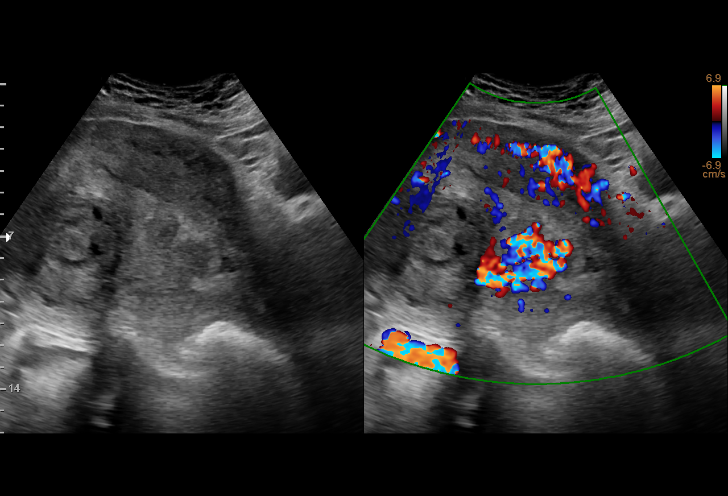

[Series 2: us pelvis complete with transvaginal · 1 of 1 slices shown (2 of 2)]
[im 1/1]
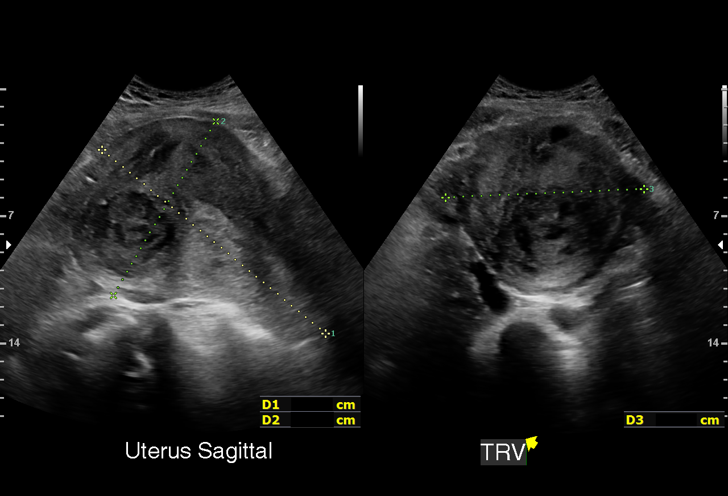

[15 of 25 positions shown; findings below may reference images not displayed]

FINDINGS: Uterus

Measurements: 16 x 11 x 11 cm = volume: 10 30 mL. There is a 6.3 x 5
x 4.7 cm mass in the fundus of uterus consistent with uterine
fibroid.

Endometrium

Thickness: 38 mm. The endometrium is heterogeneous with increased
focal vascularity.

Right ovary

Measurements: 3.3 x 2.1 x 2.3 cm = volume: 8.7 mL. Normal
appearance/no adnexal mass.

Left ovary

Measurements: 4.5 x 1.7 x 3.4 cm = volume: 17.47 mL. Normal
appearance/no adnexal mass.

Other findings

No abnormal free fluid.
IMPRESSION: Thickened heterogeneous endometrium with focal vascularity
suspicious for retained products of conception.

Uterine fibroid.

## 2024-01-24 ENCOUNTER — Ambulatory Visit
Admission: EM | Admit: 2024-01-24 | Discharge: 2024-01-24 | Disposition: A | Discharge status: Home / Self Care | Attending: Student | Admitting: Student

## 2024-01-24 DIAGNOSIS — J029 Acute pharyngitis, unspecified: Secondary | ICD-10-CM | POA: Diagnosis not present

## 2024-01-24 DIAGNOSIS — Z3202 Encounter for pregnancy test, result negative: Secondary | ICD-10-CM | POA: Insufficient documentation

## 2024-01-24 LAB — POCT URINE PREGNANCY: Preg Test, Ur: NEGATIVE

## 2024-01-24 LAB — POCT RAPID STREP A (OFFICE): Rapid Strep A Screen: NEGATIVE

## 2024-01-24 MED ORDER — KETOROLAC TROMETHAMINE 15 MG/ML IJ SOLN
30.0000 mg | Freq: Once | INTRAMUSCULAR | Status: AC
  Administered 2024-01-24: 30 mg via INTRAVENOUS

## 2024-01-24 NOTE — Discharge Instructions (Addendum)
-  You are not pregnant - A pain medication was administered today for your sore throat. -Starting tomorrow: you can take Tylenol  up to 1000 mg 3 times daily, and ibuprofen  up to 600 mg 3 times daily with food.  You can take these together, or alternate every 3-4 hours.

## 2024-01-24 NOTE — ED Provider Notes (Signed)
 [facility]    CSN: [id number] Arrival date & time: 01/24/24  1647      History   Chief Complaint Chief Complaint  Patient presents with   Sore Throat    HPI Ashley Jennings is a 33 y.o. female presenting with sore throat, tonsillar hypertrophy and mild cough.  Medical history noncontributory.  Denies past medical history of pulmonary disease.  Has not attempted medications for the symptoms yet.  Denies known sick exposure. Denies fever, rhinorrhea, rash, hives, nausea, or vomiting.   HPI  Past Medical History:  Diagnosis Date   Depression jan 2012   states just short-term when her mom passed away    Maternal varicella, non-immune 06/01/2011   Immunize postpartum    No pertinent past medical history    Pregnancy induced hypertension     Patient Active Problem List   Diagnosis Date Noted   PROM (premature rupture of membranes) February 17, 2021   Fetal demise before 20 weeks with retention of dead fetus 02-17-2021   Vaginal bleeding in pregnancy, second trimester 17-Feb-2021   Chronic hypertension during pregnancy, antepartum 02-17-2021   IUFD at less than 20 weeks of gestation 2021/02/17   Hx of preeclampsia, prior pregnancy, currently pregnant 01/19/2021   Maternal obesity affecting pregnancy, antepartum 01/19/2021   Supervision of other normal pregnancy, antepartum 01/16/2021   Sexually transmitted disease complicating pregnancy, antepartum 01/15/2021    Past Surgical History:  Procedure Laterality Date   DILATION AND CURETTAGE OF UTERUS     INDUCED ABORTION      OB History     Gravida  4   Para  2   Term  1   Preterm  1   AB  2   Living  1      SAB  0   IAB  2   Ectopic  0   Multiple      Live Births  1            Home Medications    Prior to Admission medications   Medication Sig Start Date End Date Taking? Authorizing Provider  Cholecalciferol 1.25 MG (50000 UT) TABS Take 50,000 Units by mouth. 04/13/22  Yes [provider]  hydrochlorothiazide (HYDRODIURIL) 25 MG tablet Take 25 mg by mouth daily. 04/30/22  Yes [provider]  ferrous sulfate  (FERROUSUL) 325 (65 FE) MG tablet Take 1 tablet (325 mg total) by mouth every other day. 02/14/21   Cleatus Moccasin, MD  ibuprofen  (ADVIL ) 800 MG tablet Take 1 tablet (800 mg total) by mouth 3 (three) times daily. 02/14/21   Cleatus Moccasin, MD  NIFEdipine  (PROCARDIA  XL) 30 MG 24 hr tablet Take 1 tablet (30 mg total) by mouth daily. 07/22/21   Lola Donnice HERO, MD  ondansetron  (ZOFRAN ) 4 MG tablet Take 1 tablet (4 mg total) by mouth every 6 (six) hours. 02/18/22   Waylan Elsie PARAS, PA-C  oxyCODONE  (OXY IR/ROXICODONE ) 5 MG immediate release tablet Take 1 tablet (5 mg total) by mouth every 4 (four) hours as needed for severe pain or breakthrough pain. 02/14/21   Cleatus Moccasin, MD  Prenatal Vit-Fe Fumarate-FA (PREPLUS) 27-1 MG TABS Take 1 tablet by mouth daily. 01/12/21   Eveline Lynwood MATSU, MD  sulfamethoxazole -trimethoprim  (BACTRIM  DS) 800-160 MG tablet Take 1 tablet by mouth 2 (two) times daily. 02/14/21   Rasch, Delon FERNS, NP    Family History Family History  Problem Relation Age of Onset   Diabetes Mother    Kidney disease Mother  Hypertension Mother    Anesthesia problems Neg Hx     Social History Social History   Tobacco Use   Smoking status: Former    Types: Cigarettes   Smokeless tobacco: Never  Vaping Use   Vaping status: Never Used  Substance Use Topics   Alcohol use: Yes    Alcohol/week: 1.0 standard drink of alcohol    Types: 1 Glasses of wine per week   Drug use: Not Currently     Allergies   Patient has no known allergies.   Review of Systems Review of Systems  Constitutional:  Negative for appetite change, chills and fever.  HENT:  Positive for congestion and sore throat. Negative for ear pain, rhinorrhea, sinus pressure and sinus pain.   Eyes:  Negative for redness and visual disturbance.  Respiratory:  Positive  for cough. Negative for chest tightness, shortness of breath and wheezing.   Cardiovascular:  Negative for chest pain and palpitations.  Gastrointestinal:  Negative for abdominal pain, constipation, diarrhea, nausea and vomiting.  Genitourinary:  Negative for dysuria, frequency and urgency.  Musculoskeletal:  Negative for myalgias.  Neurological:  Negative for dizziness, weakness and headaches.  Psychiatric/Behavioral:  Negative for confusion.   All other systems reviewed and are negative.    Physical Exam Triage Vital Signs ED Triage Vitals  Encounter Vitals Group     BP 01/24/24 1659 (!) 158/93     Girls Systolic BP Percentile --      Girls Diastolic BP Percentile --      Boys Systolic BP Percentile --      Boys Diastolic BP Percentile --      Pulse Rate 01/24/24 1659 67     Resp 01/24/24 1659 20     Temp 01/24/24 1659 98.8 F (37.1 C)     Temp Source 01/24/24 1659 Oral     SpO2 01/24/24 1659 97 %     Weight 01/24/24 1656 270 lb (122.5 kg)     Height 01/24/24 1656 5' 9 (1.753 m)     Head Circumference --      Peak Flow --      Pain Score --      Pain Loc --      Pain Education --      Exclude from Growth Chart --    No data found.  Updated Vital Signs BP (!) 158/93 (BP Location: Left Arm)   Pulse 67   Temp 98.8 F (37.1 C) (Oral)   Resp 20   Ht 5' 9 (1.753 m)   Wt 270 lb (122.5 kg)   LMP 01/06/2024 (Exact Date)   SpO2 97%   BMI 39.87 kg/m   Visual Acuity Right Eye Distance:   Left Eye Distance:   Bilateral Distance:    Right Eye Near:   Left Eye Near:    Bilateral Near:     Physical Exam Vitals reviewed.  Constitutional:      General: She is not in acute distress.    Appearance: Normal appearance. She is not ill-appearing.  HENT:     Head: Normocephalic and atraumatic.     Right Ear: Tympanic membrane, ear canal and external ear normal. No tenderness. No middle ear effusion. There is no impacted cerumen. Tympanic membrane is not perforated,  erythematous, retracted or bulging.     Left Ear: Tympanic membrane, ear canal and external ear normal. No tenderness.  No middle ear effusion. There is no impacted cerumen. Tympanic membrane is not perforated, erythematous, retracted  or bulging.     Nose: Nose normal. No congestion.     Mouth/Throat:     Mouth: Mucous membranes are moist.     Pharynx: Uvula midline. No oropharyngeal exudate or posterior oropharyngeal erythema.     Tonsils: 1+ on the right. 1+ on the left.     Comments: Posterior oropharyngeal erythema with no exudate.  Tonsils are 1+ bilaterally. Eyes:     Extraocular Movements: Extraocular movements intact.     Pupils: Pupils are equal, round, and reactive to light.  Cardiovascular:     Rate and Rhythm: Normal rate and regular rhythm.     Heart sounds: Normal heart sounds.  Pulmonary:     Effort: Pulmonary effort is normal.     Breath sounds: Normal breath sounds. No decreased breath sounds, wheezing, rhonchi or rales.  Abdominal:     Palpations: Abdomen is soft.     Tenderness: There is no abdominal tenderness. There is no guarding or rebound.  Lymphadenopathy:     Cervical: No cervical adenopathy.     Right cervical: No superficial cervical adenopathy.    Left cervical: No superficial cervical adenopathy.  Neurological:     General: No focal deficit present.     Mental Status: She is alert and oriented to person, place, and time.  Psychiatric:        Mood and Affect: Mood normal.        Behavior: Behavior normal.        Thought Content: Thought content normal.        Judgment: Judgment normal.      UC Treatments / Results  Labs (all labs ordered are listed, but only abnormal results are displayed) Labs Reviewed  POCT RAPID STREP A (OFFICE) - Normal  POCT URINE PREGNANCY - Normal  URINE CULTURE  CULTURE, GROUP A STREP [facility])    EKG   Radiology No results found.  Procedures Procedures (including critical care time)  Medications Ordered in  UC Medications  ketorolac  (TORADOL ) 15 MG/ML injection 30 mg (30 mg Intravenous Given 01/24/24 1902)    Initial Impression / Assessment and Plan / UC Course  I have reviewed the triage vital signs and the nursing notes.  Pertinent labs & imaging results that were available during my care of the patient were reviewed by me and considered in my medical decision making (see chart for details).     Patient is a pleasant 33 year old female presenting with viral pharyngitis.  She is afebrile and nontachycardic.  Rapid strep negative, culture sent. Urine pregnancy negative.  Toradol  IM administered for pharyngeal pain.  She does not have kidney disease.   Final Clinical Impressions(s) / UC Diagnoses   Final diagnoses:  Acute pharyngitis, unspecified etiology  Negative pregnancy test     Discharge Instructions      -You are not pregnant - A pain medication was administered today for your sore throat. -Starting tomorrow: you can take Tylenol  up to 1000 mg 3 times daily, and ibuprofen  up to 600 mg 3 times daily with food.  You can take these together, or alternate every 3-4 hours.      ED Prescriptions   None    PDMP not reviewed this encounter.   Arlyss Leita BRAVO, PA-C 01/24/24 1907

## 2024-01-24 NOTE — ED Triage Notes (Signed)
 Patient presents with sore throat, swollen tonsils, and difficulty breathing and swallowing since this morning. Noted to have a mild cough, described more as frequent throat clearing. Denies fever, rhinorrhea, rash, hives, nausea, or vomiting.

## 2024-01-27 ENCOUNTER — Ambulatory Visit (HOSPITAL_COMMUNITY): Payer: Self-pay

## 2024-01-27 LAB — CULTURE, GROUP A STREP (THRC)
